# Patient Record
Sex: Male | Born: 1960 | Race: White | Hispanic: No | Marital: Married | Smoking: Former smoker
Health system: Southern US, Community
[De-identification: ages and names within clinical notes are randomized; demographics above are authoritative.]

## PROBLEM LIST (undated history)

## (undated) DIAGNOSIS — M25569 Pain in unspecified knee: Secondary | ICD-10-CM

## (undated) DIAGNOSIS — K219 Gastro-esophageal reflux disease without esophagitis: Secondary | ICD-10-CM

## (undated) DIAGNOSIS — A4902 Methicillin resistant Staphylococcus aureus infection, unspecified site: Secondary | ICD-10-CM

## (undated) DIAGNOSIS — Z9889 Other specified postprocedural states: Secondary | ICD-10-CM

## (undated) DIAGNOSIS — M549 Dorsalgia, unspecified: Secondary | ICD-10-CM

## (undated) HISTORY — PX: KNEE SURGERY: SHX244

## (undated) HISTORY — PX: SHOULDER SURGERY: SHX246

## (undated) HISTORY — PX: GASTRIC BYPASS: SHX52

## (undated) HISTORY — PX: BACK SURGERY: SHX140

---

## 1999-03-26 ENCOUNTER — Emergency Department (HOSPITAL_COMMUNITY): Admission: EM | Admit: 1999-03-26 | Discharge: 1999-03-26 | Payer: Self-pay | Admitting: Emergency Medicine

## 2020-03-17 ENCOUNTER — Other Ambulatory Visit: Payer: Self-pay

## 2020-03-17 ENCOUNTER — Emergency Department (INDEPENDENT_AMBULATORY_CARE_PROVIDER_SITE_OTHER): Payer: Medicare Other

## 2020-03-17 ENCOUNTER — Encounter: Payer: Self-pay | Admitting: Emergency Medicine

## 2020-03-17 ENCOUNTER — Emergency Department (INDEPENDENT_AMBULATORY_CARE_PROVIDER_SITE_OTHER)
Admission: EM | Admit: 2020-03-17 | Discharge: 2020-03-17 | Disposition: A | Discharge status: Home / Self Care | Payer: Medicare Other | Source: Home / Self Care

## 2020-03-17 DIAGNOSIS — M1990 Unspecified osteoarthritis, unspecified site: Secondary | ICD-10-CM

## 2020-03-17 DIAGNOSIS — M25532 Pain in left wrist: Secondary | ICD-10-CM

## 2020-03-17 MED ORDER — PREDNISONE 50 MG PO TABS: 50.0000 mg | ORAL_TABLET | Freq: Every day | ORAL | 0 refills | Status: AC

## 2020-03-17 MED ORDER — METHYLPREDNISOLONE ACETATE 80 MG/ML IJ SUSP
80.0000 mg | Freq: Once | INTRAMUSCULAR | Status: AC
  Administered 2020-03-17: 80 mg via INTRAMUSCULAR

## 2020-03-17 NOTE — Discharge Instructions (Signed)
  You were given your first dose of depo-medrol, a steroid to help with inflammation and pain. You may start your prednisone pills tomorrow with breakfast. Be sure to take with food to help prevent stomach upset, ulcers and stomach bleeds. Call to schedule a follow up appointment with family medicine later this week for further evaluation and treatment of your symptoms.

## 2020-03-17 NOTE — ED Triage Notes (Signed)
Patient c/o left wrist pain x 3-4 days, no known injury, can't remember if he had fallen or not.  Patient has taken Morphine for pain.

## 2020-03-17 NOTE — ED Provider Notes (Signed)
[name]    CSN: [id number] Arrival date & time: 03/17/20  1025      History   Chief Complaint Chief Complaint  Patient presents with  . Wrist Pain    HPI Brady Ochoa is a 59 y.o. male.   HPI  Brady Ochoa is a 59 y.o. male presenting to UC with c/o Left wrist pain that started 3-4 days ago. No known injury.  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 No hx of gout. Denies redness or swelling of wrist.    History reviewed. No pertinent past medical history.  There are no problems to display for this patient.   History reviewed. No pertinent surgical history.     Home Medications    Prior to Admission medications   Medication Sig Start Date End Date Taking? Authorizing Provider  Cholecalciferol 25 MCG (1000 UT) capsule Take by mouth.   Yes [provider]  furosemide (LASIX) 20 MG tablet TAKE 1 TABLET(20 MG) BY MOUTH DAILY 07/24/19  Yes [provider]  gabapentin (NEURONTIN) 600 MG tablet Take by mouth. 02/26/20  Yes [provider]  meclizine (ANTIVERT) 25 MG tablet Take 25 mg by mouth 3 (three) times daily as needed. 02/21/20  Yes [provider]  morphine (MSIR) 15 MG tablet Take by mouth. 07/13/18  Yes [provider]  naloxone (NARCAN) 4 MG/0.1ML LIQD nasal spray kit U 1 SPR IEN 1 TIME 07/13/18  Yes [provider]  omeprazole (PRILOSEC) 20 MG capsule Take by mouth. 09/04/19  Yes [provider]  potassium chloride (MICRO-K) 10 MEQ CR capsule TAKE 1 CAPSULE BY MOUTH WITH EACH LASIX(FUROSEMIDE) DOSE 10/21/14  Yes [provider]  predniSONE (DELTASONE) 50 MG tablet Take 1 tablet (50 mg total) by mouth daily with breakfast for 5 days. 03/17/20 03/22/20  Noe Gens, PA-C    Family History No family  history on file.  Social History Social History   Tobacco Use  . Smoking status: Never Smoker  . Smokeless tobacco: Never Used  Substance Use Topics  . Alcohol use: Not on file  . Drug use: Not on file     Allergies   Patient has no known allergies.   Review of Systems Review of Systems  Musculoskeletal: Positive for arthralgias. Negative for gait problem and joint swelling.  Skin: Negative for color change and wound.     Physical Exam Triage Vital Signs ED Triage Vitals [03/17/20 1113]  Enc Vitals Group     BP 124/79     Pulse Rate 61     Resp      Temp      Temp src      SpO2 93 %     Weight      Height      Head Circumference      Peak Flow      Pain Score 8     Pain Loc      Pain Edu?      Excl. in [place]?    No data found.  Updated Vital Signs BP 124/79 (BP Location: Right Arm)   Pulse 61   SpO2 93%   Visual Acuity Right Eye Distance:   Left Eye Distance:   Bilateral Distance:    Right Eye  Near:   Left Eye Near:    Bilateral Near:     Physical Exam Vitals and nursing note reviewed.  Constitutional:      General: He is not in acute distress.    Appearance: Normal appearance. He is well-developed. He is not ill-appearing, toxic-appearing or diaphoretic.  HENT:     Head: Normocephalic and atraumatic.  Cardiovascular:     Rate and Rhythm: Normal rate and regular rhythm.     Pulses:          Radial pulses are 2+ on the left side.  Pulmonary:     Effort: Pulmonary effort is normal.  Musculoskeletal:        General: Tenderness present. No swelling. Normal range of motion.     Cervical back: Normal range of motion.     Comments: Left wrist: no obvious edema or deformity. Full ROM, diffuse tenderness.  Full ROM Left shoulder, elbow and fingers: no tenderness to shoulder, elbow or fingers.  Skin:    General: Skin is warm and dry.     Capillary Refill: Capillary refill takes less than 2 seconds.     Findings: No bruising or erythema.    Neurological:     Mental Status: He is alert and oriented to person, place, and time.     Sensory: No sensory deficit.  Psychiatric:        Behavior: Behavior normal.      UC Treatments / Results  Labs (all labs ordered are listed, but only abnormal results are displayed) Labs Reviewed - No data to display  EKG   Radiology No results found.  Procedures Procedures (including critical care time)  Medications Ordered in UC Medications  methylPREDNISolone acetate (DEPO-MEDROL) injection 80 mg (80 mg Intramuscular Given 03/17/20 1214)    Initial Impression / Assessment and Plan / UC Course  I have reviewed the triage vital signs and the nursing notes.  Pertinent labs & imaging results that were available during my care of the patient were reviewed by me and considered in my medical decision making (see chart for details).     Discussed imaging with pt Encouraged f/u with PCP, may need referral to specialist for further evaluation of inflammatory arthritis AVS given  Final Clinical Impressions(s) / UC Diagnoses   Final diagnoses:  Left wrist pain  Inflammatory arthritis     Discharge Instructions      You were given your first dose of depo-medrol, a steroid to help with inflammation and pain. You may start your prednisone pills tomorrow with breakfast. Be sure to take with food to help prevent stomach upset, ulcers and stomach bleeds. Call to schedule a follow up appointment with family medicine later this week for further evaluation and treatment of your symptoms.     ED Prescriptions    Medication Sig Dispense Auth. Provider   predniSONE (DELTASONE) 50 MG tablet Take 1 tablet (50 mg total) by mouth daily with breakfast for 5 days. 5 tablet Noe Gens, PA-C     PDMP not reviewed this encounter.   Noe Gens, [place] 03/20/20 [id number]

## 2020-10-05 ENCOUNTER — Other Ambulatory Visit: Payer: Self-pay

## 2020-10-05 ENCOUNTER — Emergency Department (INDEPENDENT_AMBULATORY_CARE_PROVIDER_SITE_OTHER): Payer: Medicare Other

## 2020-10-05 ENCOUNTER — Emergency Department
Admission: EM | Admit: 2020-10-05 | Discharge: 2020-10-05 | Disposition: A | Discharge status: Home / Self Care | Payer: Medicare Other | Source: Home / Self Care | Attending: Family Medicine | Admitting: Family Medicine

## 2020-10-05 ENCOUNTER — Encounter: Payer: Self-pay | Admitting: Emergency Medicine

## 2020-10-05 DIAGNOSIS — S61220A Laceration with foreign body of right index finger without damage to nail, initial encounter: Secondary | ICD-10-CM

## 2020-10-05 DIAGNOSIS — Z23 Encounter for immunization: Secondary | ICD-10-CM | POA: Diagnosis not present

## 2020-10-05 DIAGNOSIS — S61330A Puncture wound without foreign body of right index finger with damage to nail, initial encounter: Secondary | ICD-10-CM | POA: Diagnosis not present

## 2020-10-05 HISTORY — DX: Methicillin resistant Staphylococcus aureus infection, unspecified site: A49.02

## 2020-10-05 HISTORY — DX: Pain in unspecified knee: M25.569

## 2020-10-05 HISTORY — DX: Dorsalgia, unspecified: M54.9

## 2020-10-05 MED ORDER — SULFAMETHOXAZOLE-TRIMETHOPRIM 800-160 MG PO TABS: 1.0000 | ORAL_TABLET | Freq: Two times a day (BID) | ORAL | 0 refills | Status: AC

## 2020-10-05 MED ORDER — ACETAMINOPHEN 500 MG PO TABS
1000.0000 mg | ORAL_TABLET | Freq: Once | ORAL | Status: AC
  Administered 2020-10-05: 1000 mg via ORAL

## 2020-10-05 MED ORDER — TETANUS-DIPHTH-ACELL PERTUSSIS 5-2.5-18.5 LF-MCG/0.5 IM SUSY
0.5000 mL | PREFILLED_SYRINGE | Freq: Once | INTRAMUSCULAR | Status: AC
  Administered 2020-10-05: 0.5 mL via INTRAMUSCULAR

## 2020-10-05 MED ORDER — LIDOCAINE-EPINEPHRINE-TETRACAINE (LET) TOPICAL GEL
3.0000 mL | Freq: Once | TOPICAL | Status: AC
  Administered 2020-10-05: 3 mL via TOPICAL

## 2020-10-05 NOTE — ED Provider Notes (Signed)
[name]    CSN: [id number] Arrival date & time: 10/05/20  1145      History   Chief Complaint Chief Complaint  Patient presents with  . Finger Injury    HPI Brady Ochoa is a 60 y.o. male.   HPI  Patient is here for finger injury.  He cut his Brady Ochoa right index fingertip while putting down ceramic tile.  There is a flap present.  No bleeding.  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  He is not worried about infection at this time, however, he states that he thinks there is a piece of tile inside his finger.  Not easily visible.  Past Medical History:  Diagnosis Date  . Back pain   . Knee pain   . MRSA infection     There are no problems to display for this patient.   Past Surgical History:  Procedure Laterality Date  . BACK SURGERY    . GASTRIC BYPASS    . KNEE SURGERY    . SHOULDER SURGERY         Home Medications    Prior to Admission medications   Medication Sig Start Date End Date Taking? Authorizing Provider  sulfamethoxazole-trimethoprim (BACTRIM DS) 800-160 MG tablet Take 1 tablet by mouth 2 (two) times daily for 7 days. 10/05/20 10/12/20 Yes Raylene Everts, MD  Cholecalciferol 25 MCG (1000 UT) capsule Take by mouth.    [provider]  furosemide (LASIX) 20 MG tablet TAKE 1 TABLET(20 MG) BY MOUTH DAILY 07/24/19   [provider]  gabapentin (NEURONTIN) 600 MG tablet Take by mouth. 02/26/20   [provider]  meclizine (ANTIVERT) 25 MG tablet Take 25 mg by mouth 3 (three) times daily as needed. 02/21/20   [provider]  morphine (MSIR) 15 MG tablet Take by mouth. 07/13/18   [provider]  naloxone Karma Greaser) 4 MG/0.1ML LIQD nasal spray kit U 1 SPR IEN 1 TIME 07/13/18   [provider]  omeprazole (PRILOSEC) 20 MG capsule Take by mouth. 09/04/19   [provider]  potassium chloride (MICRO-K) 10 MEQ CR capsule TAKE 1 CAPSULE BY MOUTH WITH EACH LASIX(FUROSEMIDE) DOSE  10/21/14   [provider]    Family History No family history on file.  Social History Social History   Tobacco Use  . Smoking status: Former Research scientist (life sciences)  . Smokeless tobacco: Never Used     Allergies   Lisinopril   Review of Systems Review of Systems See HPI  Physical Exam Triage Vital Signs ED Triage Vitals  Enc Vitals Group     BP 10/05/20 1216 118/76     Pulse Rate 10/05/20 1216 84     Resp 10/05/20 1216 16     Temp 10/05/20 1215 98.9 F (37.2 C)     Temp Source 10/05/20 1216 Oral     SpO2 10/05/20 1216 95 %     Weight 10/05/20 1217 240 lb (108.9 kg)     Height 10/05/20 1217 _0  (1.702 m)     Head Circumference --      Peak Flow --      Pain Score 10/05/20 1216 7     Pain Loc --      Pain Edu? --      Excl. in [place]? --    No data found.  Updated Vital Signs BP 118/76 (BP Location: Left Arm)   Pulse 84   Temp 98.9 F (37.2 C) (  Oral)   Resp 16   Ht _0  (1.702 m)   Wt 108.9 kg   SpO2 95%   BMI 37.59 kg/m      Physical Exam Constitutional:      General: He is not in acute distress.    Appearance: He is well-developed.  HENT:     Head: Normocephalic and atraumatic.  Eyes:     Conjunctiva/sclera: Conjunctivae normal.     Pupils: Pupils are equal, round, and reactive to light.  Cardiovascular:     Rate and Rhythm: Normal rate.  Pulmonary:     Effort: Pulmonary effort is normal. No respiratory distress.  Abdominal:     General: There is no distension.     Palpations: Abdomen is soft.  Musculoskeletal:        General: Normal range of motion.     Cervical back: Normal range of motion.  Skin:    General: Skin is warm and dry.     Comments: Right index finger has a flap type of laceration.  The flap is dead skin with no vascular supply.  It is removed.  There is no foreign body visible on x-ray.  With minimal exploration it is found and removed.  Areas irrigated.  L ET anesthesia was successful.  Neurological:     Mental Status: He is  alert.  Psychiatric:        Behavior: Behavior normal.      UC Treatments / Results  Labs (all labs ordered are listed, but only abnormal results are displayed) Labs Reviewed - No data to display  EKG   Radiology DG Finger Index Right  Result Date: 10/05/2020 CLINICAL DATA:  Laceration to index finger with possible foreign body. EXAM: RIGHT INDEX FINGER 2+V COMPARISON:  None. FINDINGS: Two linear radiopaque foreign bodies within the superficial soft tissues of the tip of the index finger noted, measuring 0.6 cm and 0.1 cm in length. No fracture or subluxation identified. IMPRESSION: Two linear radiopaque foreign bodies within the superficial soft tissues of the tip of the index finger. Electronically Signed   By: Margarette Canada M.D.   On: 10/05/2020 12:46    Procedures Procedures (including critical care time)  Medications Ordered in UC Medications  Tdap (BOOSTRIX) injection 0.5 mL (0.5 mLs Intramuscular Given 10/05/20 1237)  acetaminophen (TYLENOL) tablet 1,000 mg (1,000 mg Oral Given 10/05/20 1232)  lidocaine-EPINEPHrine-tetracaine (LET) topical gel (3 mLs Topical Given 10/05/20 1325)    Initial Impression / Assessment and Plan / UC Course  I have reviewed the triage vital signs and the nursing notes.  Pertinent labs & imaging results that were available during my care of the patient were reviewed by me and considered in my medical decision making (see chart for details).      Wound is not infection.  It is discussed.  I told him that the first sign of infection he started an antibiotic, but not use it if this heals well without Final Clinical Impressions(s) / UC Diagnoses   Final diagnoses:  Laceration of right index finger with foreign body without damage to nail, initial encounter     Discharge Instructions     Avoid trauma to the finger Watch for infection Return as needed Fill and take antibiotic if you see signs of infection    ED Prescriptions    Medication  Sig Dispense Auth. Provider   sulfamethoxazole-trimethoprim (BACTRIM DS) 800-160 MG tablet Take 1 tablet by mouth 2 (two) times daily for 7 days. 14 tablet  Raylene Everts, MD     PDMP not reviewed this encounter.   Raylene Everts, MD 10/05/20 1520

## 2020-10-05 NOTE — ED Triage Notes (Addendum)
Patient here 2 days after lacerating right index fingertip with ceramic tile; flap is present without bleeding; possible piece of tile seen. History of MRSA infections. Unknown date last tdap. Has had all 3 covid vaccinations.

## 2020-10-05 NOTE — Discharge Instructions (Addendum)
Avoid trauma to the finger Watch for infection Return as needed Fill and take antibiotic if you see signs of infection

## 2020-11-01 ENCOUNTER — Other Ambulatory Visit: Payer: Self-pay

## 2020-11-01 ENCOUNTER — Encounter: Payer: Self-pay | Admitting: Emergency Medicine

## 2020-11-01 ENCOUNTER — Emergency Department
Admission: EM | Admit: 2020-11-01 | Discharge: 2020-11-01 | Disposition: A | Discharge status: Home / Self Care | Payer: Medicare Other | Source: Home / Self Care | Attending: Family Medicine | Admitting: Family Medicine

## 2020-11-01 DIAGNOSIS — G5622 Lesion of ulnar nerve, left upper limb: Secondary | ICD-10-CM | POA: Diagnosis not present

## 2020-11-01 MED ORDER — PREDNISONE 10 MG (21) PO TBPK: ORAL_TABLET | Freq: Every day | ORAL | 0 refills | Status: DC

## 2020-11-01 MED ORDER — KETOROLAC TROMETHAMINE 60 MG/2ML IM SOLN
60.0000 mg | Freq: Once | INTRAMUSCULAR | Status: AC
  Administered 2020-11-01: 60 mg via INTRAMUSCULAR

## 2020-11-01 NOTE — ED Provider Notes (Signed)
[name]    CSN: [id number] Arrival date & time: 11/01/20  0900      History   Chief Complaint Chief Complaint  Patient presents with  . Arm Pain    HPI Brady Ochoa is a 60 y.o. male.   HPI   Patient states that he rapped his left arm on a hard on the surface, and hit just below his elbow near distal ulna.  He states ever since that has been having severe pain that radiates from his elbow all the way down to his fifth finger.  He states that he finds the pain severe, and constant.  He states it is keeping him awake.  He also is getting tingling in his fifth finger.  He has pain with grip and use of his hand.  Pain with certain movements.  Patient has a narcotic contract through a pain clinic in [place].  He is on morphine ER and IR for pain.  I have told him he needs to call his pain clinic if he needs additional pain medication.  He is also on gabapentin.  I have told him he can increase his gabapentin.  Past Medical History:  Diagnosis Date  . Back pain   . Knee pain   . MRSA infection     There are no problems to display for this patient.   Past Surgical History:  Procedure Laterality Date  . BACK SURGERY    . GASTRIC BYPASS    . KNEE SURGERY    . SHOULDER SURGERY         Home Medications    Prior to Admission medications   Medication Sig Start Date End Date Taking? Authorizing Provider  Cholecalciferol 25 MCG (1000 UT) capsule Take by mouth.   Yes [provider]  furosemide (LASIX) 20 MG tablet TAKE 1 TABLET(20 MG) BY MOUTH DAILY 07/24/19  Yes [provider]  gabapentin (NEURONTIN) 600 MG tablet Take by mouth. 02/26/20  Yes [provider]  meclizine (ANTIVERT) 25 MG tablet Take 25 mg by mouth 3 (three) times daily as needed. 02/21/20  Yes [provider]  morphine (MSIR) 15 MG tablet Take by mouth. 07/13/18  Yes [provider]  naloxone (NARCAN) 4 MG/0.1ML LIQD nasal spray kit U 1 SPR IEN 1  TIME 07/13/18  Yes [provider]  omeprazole (PRILOSEC) 20 MG capsule Take by mouth. 09/04/19  Yes [provider]  potassium chloride (MICRO-K) 10 MEQ CR capsule TAKE 1 CAPSULE BY MOUTH WITH EACH LASIX(FUROSEMIDE) DOSE 10/21/14  Yes [provider]  predniSONE (STERAPRED UNI-PAK 21 TAB) 10 MG (21) TBPK tablet Take by mouth daily. Take 6 tabs by mouth daily  for 2 days, then 5 tabs for 2 days, then 4 tabs for 2 days, then 3 tabs for 2 days, 2 tabs for 2 days, then 1 tab by mouth daily for 2 days 11/01/20  Yes Raylene Everts, MD    Family History No family history on file.  Social History Social History   Tobacco Use  . Smoking status: Former Research scientist (life sciences)  . Smokeless tobacco: Never Used     Allergies   Lisinopril   Review of Systems Review of Systems See HPI  Physical Exam Triage Vital Signs ED Triage Vitals [11/01/20 0916]  Enc Vitals Group     BP 132/85     Pulse Rate 64     Resp 16     Temp      Temp src  SpO2 94 %     Weight      Height      Head Circumference      Peak Flow      Pain Score 8     Pain Loc      Pain Edu?      Excl. in [place]?    No data found.  Updated Vital Signs BP 132/85 (BP Location: Left Arm)   Pulse 64   SpO2 94%      Physical Exam Constitutional:      General: He is not in acute distress.    Appearance: He is well-developed. He is obese.     Comments: Appears uncomfortable  HENT:     Head: Normocephalic and atraumatic.     Mouth/Throat:     Comments: Mask is in place Eyes:     Conjunctiva/sclera: Conjunctivae normal.     Pupils: Pupils are equal, round, and reactive to light.  Cardiovascular:     Rate and Rhythm: Normal rate.  Pulmonary:     Effort: Pulmonary effort is normal. No respiratory distress.  Abdominal:     General: There is no distension.     Palpations: Abdomen is soft.  Musculoskeletal:        General: Normal range of motion.     Cervical back: Normal range of motion.  Skin:     General: Skin is warm and dry.  Neurological:     Mental Status: He is alert.     Sensory: Sensory deficit present.     Motor: Weakness present.     Comments: Tenderness at the cubital tunnel at the elbow.  Pain with stretching of ulnar nerve.  Limited grip strength and hand with subjective diminished sensation in the fifth finger.  Psychiatric:        Behavior: Behavior normal.      UC Treatments / Results  Labs (all labs ordered are listed, but only abnormal results are displayed) Labs Reviewed - No data to display  EKG   Radiology No results found.  Procedures Procedures (including critical care time)  Medications Ordered in UC Medications  ketorolac (TORADOL) injection 60 mg (60 mg Intramuscular Given 11/01/20 0957)    Initial Impression / Assessment and Plan / UC Course  I have reviewed the triage vital signs and the nursing notes.  Pertinent labs & imaging results that were available during my care of the patient were reviewed by me and considered in my medical decision making (see chart for details).     Described patient his nerve inflammation.  Treatment includes ice, immobilization, and anti-inflammatories.  He will need to get additional pain medicine, if needed, from his pain clinic.  He should see his PCP if he fails to improve, physical therapy, nerve conduction studies, specialty referral may be indicated Final Clinical Impressions(s) / UC Diagnoses   Final diagnoses:  Cubital tunnel syndrome on left  Ulnar nerve compression, left     Discharge Instructions     Use the ice for 20 min every couple of hours Take the prednisone as directed Take all of day one today I am unable to give you narcotics because of your pain contract.  The toradol will help within the hour Call your pain clinic if you need more morphine See your primary care doctor if you need more testing or referral    ED Prescriptions    Medication Sig Dispense Auth. Provider    predniSONE (STERAPRED UNI-PAK 21 TAB) 10 MG (21)  TBPK tablet Take by mouth daily. Take 6 tabs by mouth daily  for 2 days, then 5 tabs for 2 days, then 4 tabs for 2 days, then 3 tabs for 2 days, 2 tabs for 2 days, then 1 tab by mouth daily for 2 days 42 tablet Meda Coffee Jennette Banker, MD     PDMP not reviewed this encounter.   Raylene Everts, MD 11/01/20 1000

## 2020-11-01 NOTE — ED Triage Notes (Signed)
Patient c/o left forearm pain for several weeks.  No apparent injury.  Patient states that the pain just throbs.  No OTC pain meds.

## 2020-11-01 NOTE — Discharge Instructions (Addendum)
Use the ice for 20 min every couple of hours Take the prednisone as directed Take all of day one today I am unable to give you narcotics because of your pain contract.  The toradol will help within the hour Call your pain clinic if you need more morphine See your primary care doctor if you need more testing or referral

## 2021-01-04 ENCOUNTER — Emergency Department (INDEPENDENT_AMBULATORY_CARE_PROVIDER_SITE_OTHER)
Admission: EM | Admit: 2021-01-04 | Discharge: 2021-01-04 | Disposition: A | Discharge status: Home / Self Care | Payer: Medicare Other | Source: Home / Self Care

## 2021-01-04 ENCOUNTER — Emergency Department (INDEPENDENT_AMBULATORY_CARE_PROVIDER_SITE_OTHER): Payer: Medicare Other

## 2021-01-04 ENCOUNTER — Encounter: Payer: Self-pay | Admitting: Emergency Medicine

## 2021-01-04 ENCOUNTER — Other Ambulatory Visit: Payer: Self-pay

## 2021-01-04 DIAGNOSIS — S6982XA Other specified injuries of left wrist, hand and finger(s), initial encounter: Secondary | ICD-10-CM | POA: Diagnosis not present

## 2021-01-04 DIAGNOSIS — M25532 Pain in left wrist: Secondary | ICD-10-CM | POA: Diagnosis not present

## 2021-01-04 MED ORDER — PREDNISONE 20 MG PO TABS: ORAL_TABLET | ORAL | 0 refills | Status: DC

## 2021-01-04 NOTE — ED Triage Notes (Signed)
Patient presents with left wrist pain. Has been having pain of the left wrist for about a month now. This morning when opening door to he heard something pop, having severe pain with movement of fingers and wrist.

## 2021-01-04 NOTE — ED Provider Notes (Signed)
[facility]    CSN: [id number] Arrival date & time: 01/04/21  1254      History   Chief Complaint Chief Complaint  Patient presents with   Wrist Pain    HPI Brady Ochoa is a 60 y.o. male.   HPI 60 year old male presents with left wrist pain for 1 month, reports this morning when opening door he heard something pop having severe pain with movement of fingers and wrist.  He was evaluated here on 11/01/2020 for cubital tunnel syndrome of left wrist.  Past Medical History:  Diagnosis Date   Back pain    Knee pain    MRSA infection     There are no problems to display for this patient.   Past Surgical History:  Procedure Laterality Date   BACK SURGERY     GASTRIC BYPASS     KNEE SURGERY     SHOULDER SURGERY         Home Medications    Prior to Admission medications   Medication Sig Start Date End Date Taking? Authorizing Provider  Cholecalciferol 25 MCG (1000 UT) capsule Take by mouth.   Yes [provider]  furosemide (LASIX) 20 MG tablet TAKE 1 TABLET(20 MG) BY MOUTH DAILY 07/24/19  Yes [provider]  gabapentin (NEURONTIN) 600 MG tablet Take by mouth. 02/26/20  Yes [provider]  meclizine (ANTIVERT) 25 MG tablet Take 25 mg by mouth 3 (three) times daily as needed. 02/21/20  Yes [provider]  morphine (MSIR) 15 MG tablet Take by mouth. 07/13/18  Yes [provider]  naloxone (NARCAN) 4 MG/0.1ML LIQD nasal spray kit U 1 SPR IEN 1 TIME 07/13/18  Yes [provider]  omeprazole (PRILOSEC) 20 MG capsule Take by mouth. 09/04/19  Yes [provider]  potassium chloride (MICRO-K) 10 MEQ CR capsule TAKE 1 CAPSULE BY MOUTH WITH EACH LASIX(FUROSEMIDE) DOSE 10/21/14  Yes [provider]  predniSONE (DELTASONE) 20 MG tablet Take 3 tabs PO daily x 3 days, then 2 tabs PO daily x 3 days, then 1 tab PO daily x 3 days 01/04/21  Yes Eliezer Lofts, FNP    Family History History reviewed. No  pertinent family history.  Social History Social History   Tobacco Use   Smoking status: Former    Pack years: 0.00   Smokeless tobacco: Never     Allergies   Lisinopril   Review of Systems Review of Systems  Musculoskeletal:        Left wrist pain x1 month  All other systems reviewed and are negative.   Physical Exam Triage Vital Signs ED Triage Vitals  Enc Vitals Group     BP 01/04/21 1352 118/85     Pulse Rate 01/04/21 1352 66     Resp 01/04/21 1352 18     Temp 01/04/21 1352 99 F (37.2 C)     Temp Source 01/04/21 1352 Oral     SpO2 01/04/21 1352 93 %     Weight --      Height --      Head Circumference --      Peak Flow --      Pain Score 01/04/21 1351 8     Pain Loc --      Pain Edu? --      Excl. in [place]? --    No data found.  Updated Vital Signs BP 118/85 (BP Location: Right Arm)   Pulse 66   Temp 99 F (  37.2 C) (Oral)   Resp 18   SpO2 93%   Physical Exam Vitals and nursing note reviewed.  Constitutional:      General: He is not in acute distress.    Appearance: Normal appearance. He is obese. He is ill-appearing.  HENT:     Head: Normocephalic and atraumatic.     Mouth/Throat:     Mouth: Mucous membranes are moist.     Pharynx: Oropharynx is clear.  Eyes:     Extraocular Movements: Extraocular movements intact.     Conjunctiva/sclera: Conjunctivae normal.     Pupils: Pupils are equal, round, and reactive to light.  Cardiovascular:     Rate and Rhythm: Normal rate and regular rhythm.     Pulses: Normal pulses.     Heart sounds: Normal heart sounds.  Pulmonary:     Effort: Pulmonary effort is normal.     Breath sounds: Normal breath sounds.     Comments: No adventitious breath sounds noted Musculoskeletal:     Cervical back: Normal range of motion and neck supple.     Comments: Left wrist (anterior aspect): Exam limited due to left wrist pain today, TTP over distal ulna/proximal first and second metacarpal, LROM with flexion/extension,  pain elicited with ulnar deviation, negative Tinel's sign, unable to perform Wynn Maudlin test  Skin:    General: Skin is warm and dry.  Neurological:     Mental Status: He is alert.     UC Treatments / Results  Labs (all labs ordered are listed, but only abnormal results are displayed) Labs Reviewed - No data to display  EKG   Radiology DG Wrist Complete Left  Result Date: 01/04/2021 CLINICAL DATA:  Wrist pain in a 60 year old male. EXAM: LEFT WRIST - COMPLETE 3+ VIEW COMPARISON:  March 17, 2010. FINDINGS: Cystic changes in the ulnar styloid as before. Signs of lucency about the margins of carpal bones as before. Radiocarpal degenerative changes. No sign of fracture or dislocation. No significant soft tissue swelling. Mild dorsal tilt of the lunate. IMPRESSION: 1. Degenerative changes about the wrist with similar appearance. 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 2. Mild dorsal tilt of the lunate. Question dorsal intercalated segment instability. 3. No sign of acute fracture. Electronically Signed   By: Zetta Bills M.D.   On: 01/04/2021 14:28    Procedures Procedures (including critical care time)  Medications Ordered in UC Medications - No data to display  Initial Impression / Assessment and Plan / UC Course  I have reviewed the triage vital signs and the nursing notes.  Pertinent labs & imaging results that were available during my care of the patient were reviewed by me and considered in my medical decision making (see chart for details).      Final Clinical Impressions(s) / UC Diagnoses   Final diagnoses:  Acute pain of left wrist  Fibrocartilage, triangular complex, injury, left, initial encounter     Discharge Instructions      Advised patient to take medication as directed with food to completion.  Advised patient to wear left wrist brace 24/7 for the next 2 weeks except  when bathing.  Advised patient if pain of left wrist worsens and/or unresolved please follow-up with PCP for referral to Ortho hand specialist for further evaluation.     ED Prescriptions     Medication Sig Dispense Auth. Provider   predniSONE (DELTASONE) 20 MG tablet Take 3 tabs PO  daily x 3 days, then 2 tabs PO daily x 3 days, then 1 tab PO daily x 3 days 18 tablet Eliezer Lofts, FNP      PDMP not reviewed this encounter.   Eliezer Lofts, [place] 01/04/21 1502

## 2021-01-04 NOTE — Discharge Instructions (Addendum)
Advised patient to take medication as directed with food to completion.  Advised patient to wear left wrist brace 24/7 for the next 2 weeks except when bathing.  Advised patient if pain of left wrist worsens and/or unresolved please follow-up with PCP for referral to Ortho hand specialist for further evaluation.

## 2021-02-23 ENCOUNTER — Other Ambulatory Visit: Payer: Self-pay

## 2021-02-23 ENCOUNTER — Emergency Department
Admission: EM | Admit: 2021-02-23 | Discharge: 2021-02-23 | Disposition: A | Discharge status: Home / Self Care | Payer: Medicare Other | Source: Home / Self Care

## 2021-02-23 DIAGNOSIS — L03116 Cellulitis of left lower limb: Secondary | ICD-10-CM | POA: Diagnosis not present

## 2021-02-23 DIAGNOSIS — S81802A Unspecified open wound, left lower leg, initial encounter: Secondary | ICD-10-CM

## 2021-02-23 HISTORY — DX: Gastro-esophageal reflux disease without esophagitis: K21.9

## 2021-02-23 MED ORDER — SULFAMETHOXAZOLE-TRIMETHOPRIM 800-160 MG PO TABS: 1.0000 | ORAL_TABLET | Freq: Two times a day (BID) | ORAL | 0 refills | Status: AC

## 2021-02-23 NOTE — ED Provider Notes (Signed)
[name]    CSN: [id number] Arrival date & time: 02/23/21  0807      History   Chief Complaint Chief Complaint  Patient presents with   Abrasion    HPI Brady Ochoa is a 60 y.o. male.   HPI  Patient is known to me from prior visit.  He is here with a wound infection.  He scraped his leg a few days ago, and noticed yesterday that it is becoming more red, swollen, and painful.  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  He has not had any fever or chills, malaise, or systemic signs of infection.  Past Medical History:  Diagnosis Date   Acid reflux    Back pain    Knee pain    MRSA infection     There are no problems to display for this patient.   Past Surgical History:  Procedure Laterality Date   BACK SURGERY     GASTRIC BYPASS     KNEE SURGERY     SHOULDER SURGERY         Home Medications    Prior to Admission medications   Medication Sig Start Date End Date Taking? Authorizing Provider  sulfamethoxazole-trimethoprim (BACTRIM DS) 800-160 MG tablet Take 1 tablet by mouth 2 (two) times daily for 7 days. 02/23/21 03/02/21 Yes Raylene Everts, MD  Cholecalciferol 25 MCG (1000 UT) capsule Take by mouth.    [provider]  furosemide (LASIX) 20 MG tablet TAKE 1 TABLET(20 MG) BY MOUTH DAILY 07/24/19   [provider]  gabapentin (NEURONTIN) 600 MG tablet Take by mouth. 02/26/20   [provider]  meclizine (ANTIVERT) 25 MG tablet Take 25 mg by mouth 3 (three) times daily as needed. 02/21/20   [provider]  morphine (MSIR) 15 MG tablet Take by mouth. 07/13/18   [provider]  naloxone [facility] Gre[facility]) 4 MG/0.1ML LIQD nasal spray kit U 1 SPR IEN 1 TIME 07/13/18   [provider]  omeprazole (PRILOSEC) 20 MG capsule Take by mouth. 09/04/19   [provider]  potassium chloride (MICRO-K) 10 MEQ CR capsule TAKE 1 CAPSULE BY MOUTH WITH EACH LASIX(FUROSEMIDE) DOSE 10/21/14   [provider]    Family History Family History  Problem Relation Age of Onset   Heart failure Mother    Diabetes Mother    Cancer Father     Social History Social History   Tobacco Use   Smoking status: Former    Types: Cigarettes   Smokeless tobacco: Never  Vaping Use   Vaping Use: Never used  Substance Use Topics   Alcohol use: Not Currently   Drug use: Not Currently     Allergies   Lisinopril   Review of Systems Review of Systems See HPI  Physical Exam Triage Vital Signs ED Triage Vitals  Enc Vitals Group     BP 02/23/21 0823 115/76     Pulse Rate 02/23/21 0823 82     Resp 02/23/21 0823 20     Temp 02/23/21 0823 98.8 F (37.1 C)     Temp Source 02/23/21 0823 Oral     SpO2 02/23/21 0823 94 %     Weight 02/23/21 0818 240 lb (108.9 kg)     Height 02/23/21 0818 _0  (1.702 m)     Head Circumference --      Peak Flow --      Pain Score 02/23/21 0818 6  Pain Loc --      Pain Edu? --      Excl. in [place]? --    No data found.  Updated Vital Signs BP 115/76   Pulse 82   Temp 98.8 F (37.1 C) (Oral)   Resp 20   Ht _0  (1.702 m)   Wt 108.9 kg   SpO2 94%   BMI 37.59 kg/m      Physical Exam Constitutional:      General: He is not in acute distress.    Appearance: He is well-developed. He is obese.  HENT:     Head: Normocephalic and atraumatic.     Mouth/Throat:     Comments: Mask is in place Eyes:     Conjunctiva/sclera: Conjunctivae normal.     Pupils: Pupils are equal, round, and reactive to light.  Cardiovascular:     Rate and Rhythm: Normal rate.  Pulmonary:     Effort: Pulmonary effort is normal. No respiratory distress.  Abdominal:     General: There is no distension.     Palpations: Abdomen is soft.  Musculoskeletal:        General: Normal range of motion.     Cervical back: Normal range of motion.  Skin:    General: Skin is warm and dry.     Comments: Anterior left shin has a long superficial wound with eschar.  Healing.   The ankle on the left is swollen as compared to the right and there is faint erythema surrounding the wound.  It is tender to touch.  Neurological:     Mental Status: He is alert.  Psychiatric:        Mood and Affect: Mood normal.        Behavior: Behavior normal.     UC Treatments / Results  Labs (all labs ordered are listed, but only abnormal results are displayed) Labs Reviewed - No data to display  EKG   Radiology No results found.  Procedures Procedures (including critical care time)  Medications Ordered in UC Medications - No data to display  Initial Impression / Assessment and Plan / UC Course  I have reviewed the triage vital signs and the nursing notes.  Pertinent labs & imaging results that were available during my care of the patient were reviewed by me and considered in my medical decision making (see chart for details).    Early wound infection.  Discussed antibiotics. Discussed pain management.  Patient is on morphine long-acting and IR, he is advised that he can take Tylenol or ibuprofen in addition. Final Clinical Impressions(s) / UC Diagnoses   Final diagnoses:  Cellulitis of leg, left  Leg wound, left, initial encounter     Discharge Instructions      Elevate leg to reduce swelling Take the antibiotic 2 x a day.  Take 2 doses today Call here or PCP if not improving in 2-3 days   ED Prescriptions     Medication Sig Dispense Auth. Provider   sulfamethoxazole-trimethoprim (BACTRIM DS) 800-160 MG tablet Take 1 tablet by mouth 2 (two) times daily for 7 days. 14 tablet Raylene Everts, MD      I have reviewed the PDMP during this encounter.   Raylene Everts, MD 02/23/21 [phone]

## 2021-02-23 NOTE — ED Triage Notes (Signed)
Pt presents to Urgent Care with skin abrasion to L lower leg which he believes to be infected. Pt states he scraped it on an unknown object 3 days ago (indoors). Scabbing and redness noted to anterior L lower leg.

## 2021-02-23 NOTE — Discharge Instructions (Addendum)
Elevate leg to reduce swelling Take the antibiotic 2 x a day.  Take 2 doses today Call here or PCP if not improving in 2-3 days

## 2021-04-06 ENCOUNTER — Emergency Department (INDEPENDENT_AMBULATORY_CARE_PROVIDER_SITE_OTHER)
Admission: EM | Admit: 2021-04-06 | Discharge: 2021-04-06 | Disposition: A | Discharge status: Home / Self Care | Payer: Medicare Other | Source: Home / Self Care | Attending: Family Medicine | Admitting: Family Medicine

## 2021-04-06 ENCOUNTER — Other Ambulatory Visit: Payer: Self-pay

## 2021-04-06 DIAGNOSIS — H6501 Acute serous otitis media, right ear: Secondary | ICD-10-CM

## 2021-04-06 DIAGNOSIS — H9201 Otalgia, right ear: Secondary | ICD-10-CM

## 2021-04-06 HISTORY — DX: Other specified postprocedural states: Z98.890

## 2021-04-06 MED ORDER — AMOXICILLIN-POT CLAVULANATE 875-125 MG PO TABS: 1.0000 | ORAL_TABLET | Freq: Two times a day (BID) | ORAL | 0 refills | Status: DC

## 2021-04-06 MED ORDER — ACETAMINOPHEN 325 MG PO TABS
650.0000 mg | ORAL_TABLET | Freq: Once | ORAL | Status: AC
  Administered 2021-04-06: 650 mg via ORAL

## 2021-04-06 MED ORDER — FLUTICASONE PROPIONATE 50 MCG/ACT NA SUSP: 2.0000 | Freq: Every day | NASAL | 0 refills | Status: AC

## 2021-04-06 NOTE — Discharge Instructions (Addendum)
Use Flonase twice a day until symptoms improve and then once a day through allergy season  take the Augmentin 2 times a day for a week  You may take morphine for your pain.  I am unable to write anything else since you are on a pain contract Follow-up with your usual primary care doctor

## 2021-04-06 NOTE — ED Provider Notes (Signed)
[facility]    CSN: [id number] Arrival date & time: 04/06/21  1651      History   Chief Complaint Chief Complaint  Patient presents with   Otalgia    Rt ear    HPI Brady Ochoa is a 60 y.o. male.   HPI Patient is here for right ear pain.  He states the pain is "severe".  No change in hearing.  No drainage.  No recent cough or cold symptoms.  Past Medical History:  Diagnosis Date   Acid reflux    Back pain    History of surgery on arm    Knee pain    MRSA infection     There are no problems to display for this patient.   Past Surgical History:  Procedure Laterality Date   BACK SURGERY     GASTRIC BYPASS     KNEE SURGERY     SHOULDER SURGERY         Home Medications    Prior to Admission medications   Medication Sig Start Date End Date Taking? Authorizing Provider  amoxicillin-clavulanate (AUGMENTIN) 875-125 MG tablet Take 1 tablet by mouth every 12 (twelve) hours. 04/06/21  Yes Raylene Everts, MD  fluticasone [facility]) 50 MCG/ACT nasal spray Place 2 sprays into both nostrils daily. 04/06/21  Yes Raylene Everts, MD  Cholecalciferol 25 MCG (1000 UT) capsule Take by mouth.    [provider]  furosemide (LASIX) 20 MG tablet TAKE 1 TABLET(20 MG) BY MOUTH DAILY 07/24/19   [provider]  gabapentin (NEURONTIN) 600 MG tablet Take by mouth. 02/26/20   [provider]  meclizine (ANTIVERT) 25 MG tablet Take 25 mg by mouth 3 (three) times daily as needed. 02/21/20   [provider]  morphine (MSIR) 15 MG tablet Take by mouth. 07/13/18   [provider]  naloxone Karma Greaser) 4 MG/0.1ML LIQD nasal spray kit U 1 SPR IEN 1 TIME 07/13/18   [provider]  omeprazole (PRILOSEC) 20 MG capsule Take by mouth. 09/04/19   [provider]  potassium chloride (MICRO-K) 10 MEQ CR capsule TAKE 1 CAPSULE BY MOUTH WITH EACH LASIX(FUROSEMIDE) DOSE 10/21/14   [provider]    Family History Family  History  Problem Relation Age of Onset   Heart failure Mother    Diabetes Mother    Cancer Father     Social History Social History   Tobacco Use   Smoking status: Former    Types: Cigarettes   Smokeless tobacco: Never  Vaping Use   Vaping Use: Never used  Substance Use Topics   Alcohol use: Not Currently   Drug use: Not Currently     Allergies   Lisinopril   Review of Systems Review of Systems See HPI  Physical Exam Triage Vital Signs ED Triage Vitals  Enc Vitals Group     BP 04/06/21 1715 127/84     Pulse Rate 04/06/21 1715 60     Resp 04/06/21 1715 14     Temp 04/06/21 1715 98.3 F (36.8 C)     Temp Source 04/06/21 1715 Oral     SpO2 04/06/21 1715 95 %     Weight --      Height --      Head Circumference --      Peak Flow --      Pain Score 04/06/21 1718 10     Pain Loc --      Pain Edu? --  Excl. in [place]? --    No data found.  Updated Vital Signs BP 127/84 (BP Location: Right Arm)   Pulse 60   Temp 98.3 F (36.8 C) (Oral)   Resp 14   SpO2 95%      Physical Exam Constitutional:      General: He is not in acute distress.    Appearance: He is well-developed.     Comments: Overweight.  Expresses his distress with facial grimacing and holding ear  HENT:     Head: Normocephalic and atraumatic.     Right Ear: Ear canal and external ear normal.     Left Ear: Tympanic membrane, ear canal and external ear normal.     Ears:     Comments: Right TM is dull.  Mild injection    Nose: No congestion or rhinorrhea.     Mouth/Throat:     Mouth: Mucous membranes are moist.     Pharynx: No posterior oropharyngeal erythema.  Eyes:     Conjunctiva/sclera: Conjunctivae normal.     Pupils: Pupils are equal, round, and reactive to light.  Neck:     Comments: Right cervical lymph node enlarged just below ear Cardiovascular:     Rate and Rhythm: Normal rate.  Pulmonary:     Effort: Pulmonary effort is normal. No respiratory distress.  Abdominal:      General: There is no distension.     Palpations: Abdomen is soft.  Musculoskeletal:        General: Normal range of motion.     Cervical back: Normal range of motion.  Lymphadenopathy:     Cervical: Cervical adenopathy present.  Skin:    General: Skin is warm and dry.  Neurological:     Mental Status: He is alert.  Psychiatric:        Mood and Affect: Mood normal.        Behavior: Behavior normal.     UC Treatments / Results  Labs (all labs ordered are listed, but only abnormal results are displayed) Labs Reviewed - No data to display  EKG   Radiology No results found.  Procedures Procedures (including critical care time)  Medications Ordered in UC Medications  acetaminophen (TYLENOL) tablet 650 mg (650 mg Oral Given 04/06/21 1721)    Initial Impression / Assessment and Plan / UC Course  I have reviewed the triage vital signs and the nursing notes.  Pertinent labs & imaging results that were available during my care of the patient were reviewed by me and considered in my medical decision making (see chart for details).     Final Clinical Impressions(s) / UC Diagnoses   Final diagnoses:  Otalgia of right ear  Non-recurrent acute serous otitis media of right ear     Discharge Instructions      Use Flonase twice a day until symptoms improve and then once a day through allergy season  take the Augmentin 2 times a day for a week  You may take morphine for your pain.  I am unable to write anything else since you are on a pain contract Follow-up with your usual primary care doctor   ED Prescriptions     Medication Sig Dispense Auth. Provider   amoxicillin-clavulanate (AUGMENTIN) 875-125 MG tablet Take 1 tablet by mouth every 12 (twelve) hours. 14 tablet Raylene Everts, MD   fluticasone [facility]) 50 MCG/ACT nasal spray Place 2 sprays into both nostrils daily. 16 g Raylene Everts, MD  PDMP not reviewed this encounter.   Raylene Everts,  MD 04/06/21 [phone]

## 2021-04-06 NOTE — ED Triage Notes (Signed)
Pt presents with rt ear pain that began Friday afternoon

## 2021-05-12 DEATH — deceased

## 2021-08-25 IMAGING — DX DG WRIST COMPLETE 3+V*L*
4 series · 4 of 4 positions shown · non-contrast
Comparison: March 17, 2010.

CLINICAL DATA: Wrist pain in a 59-year-old male.

EXAM:
LEFT WRIST - COMPLETE 3+ VIEW

[wrist pa]
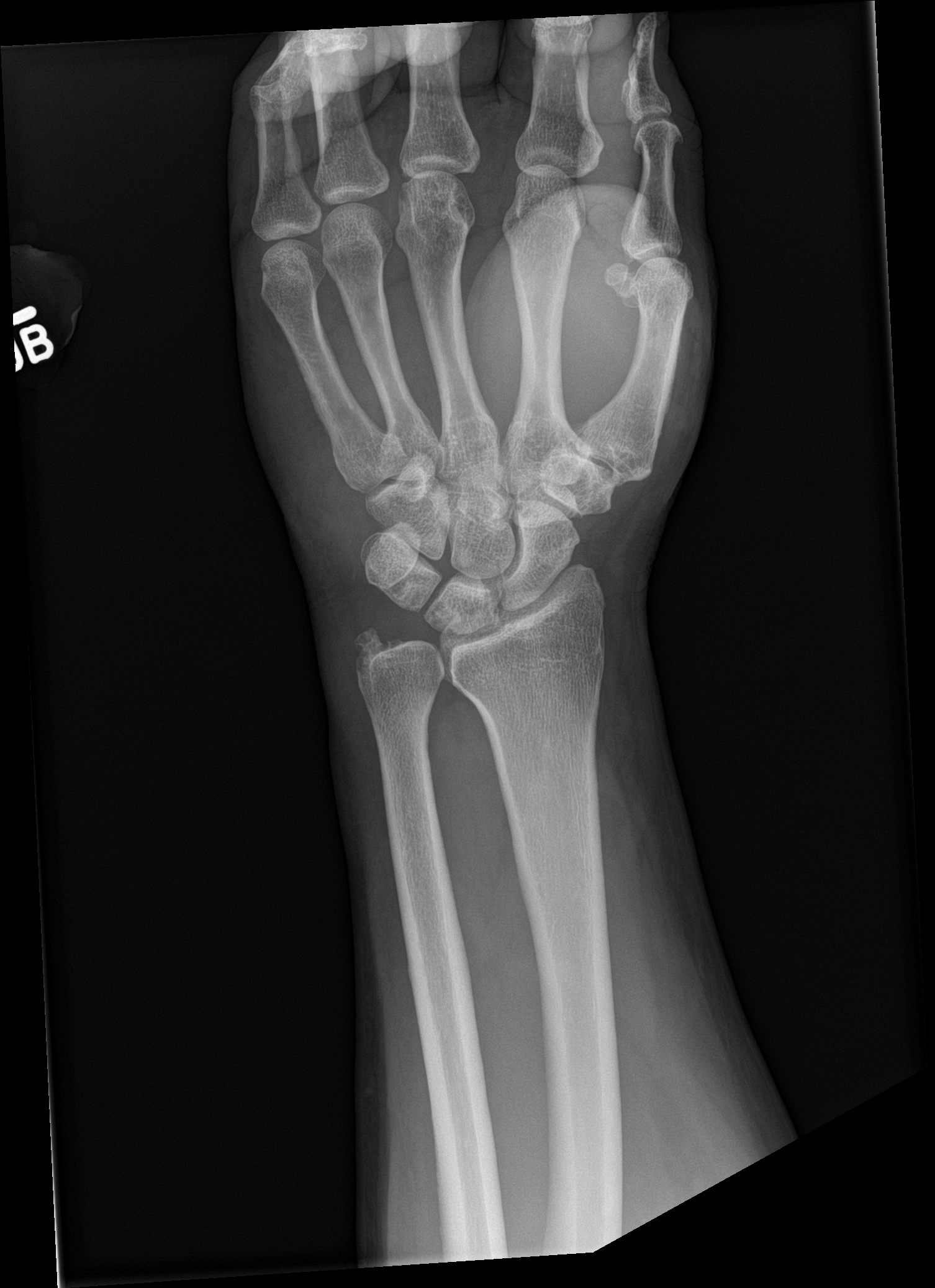

[wrist obl]
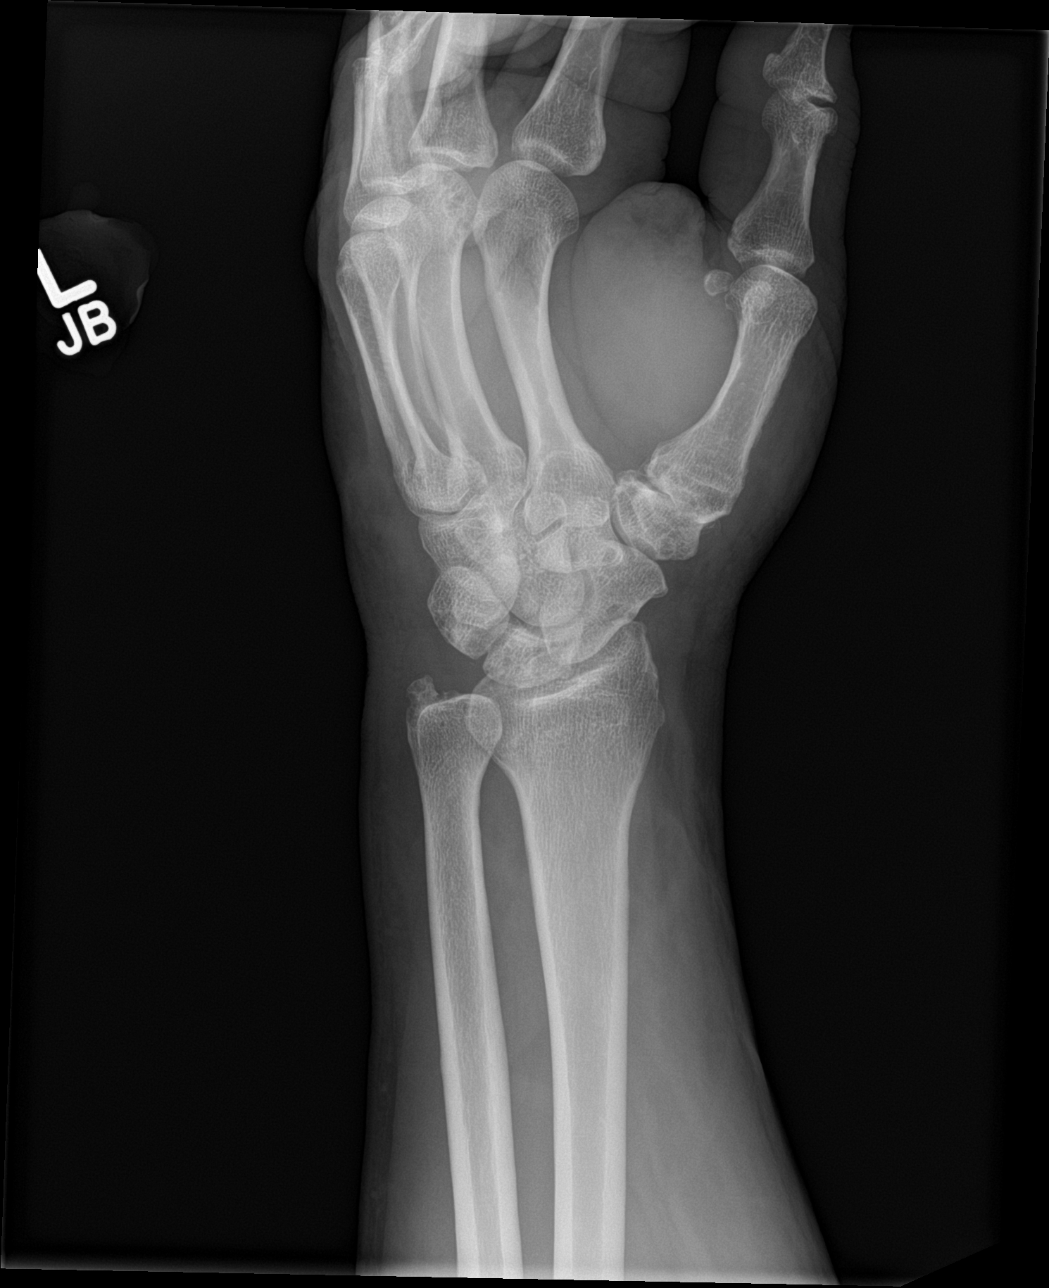

[wrist lat]
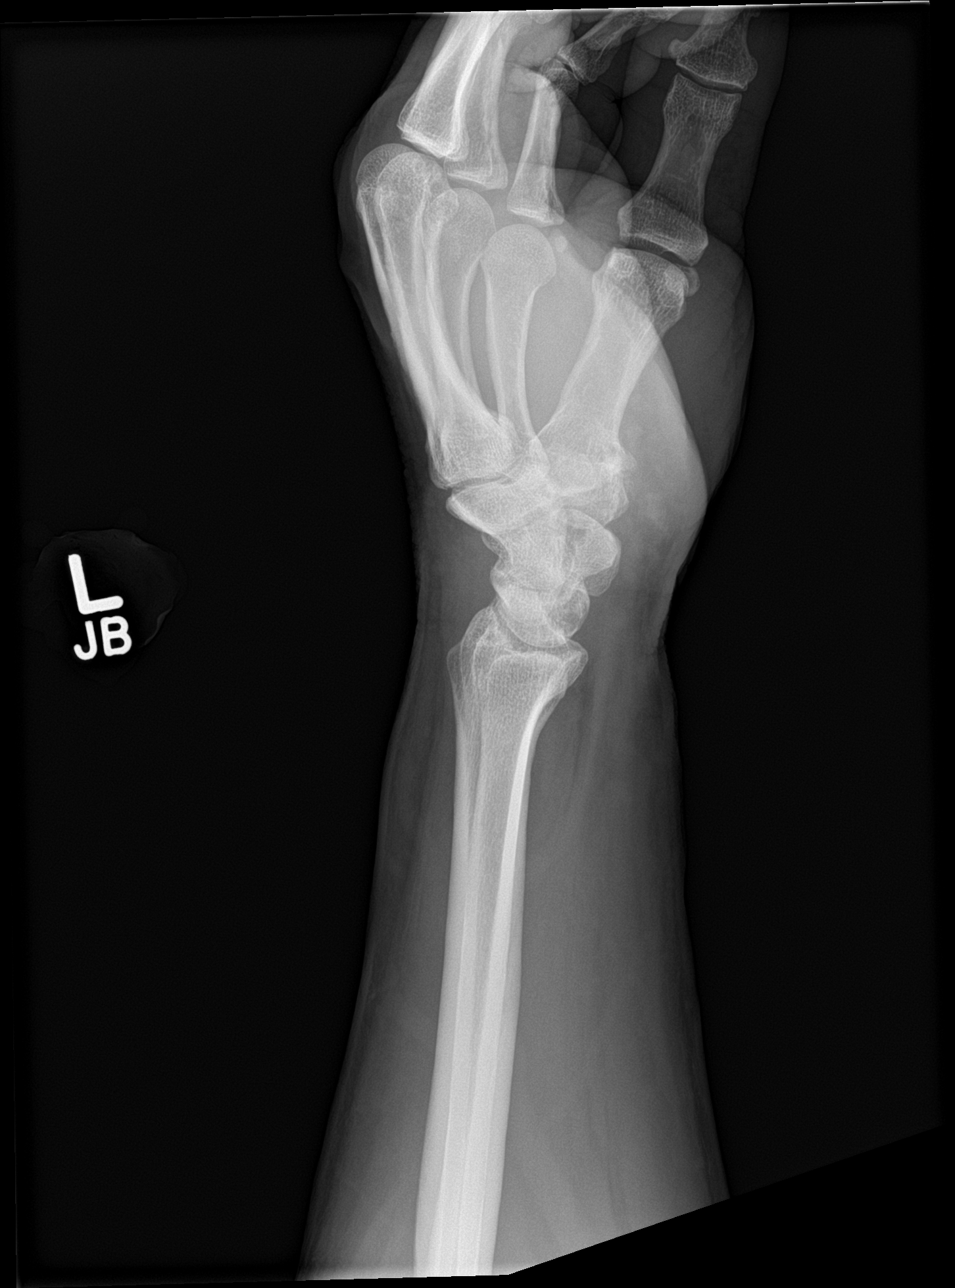

[wrist navicular]
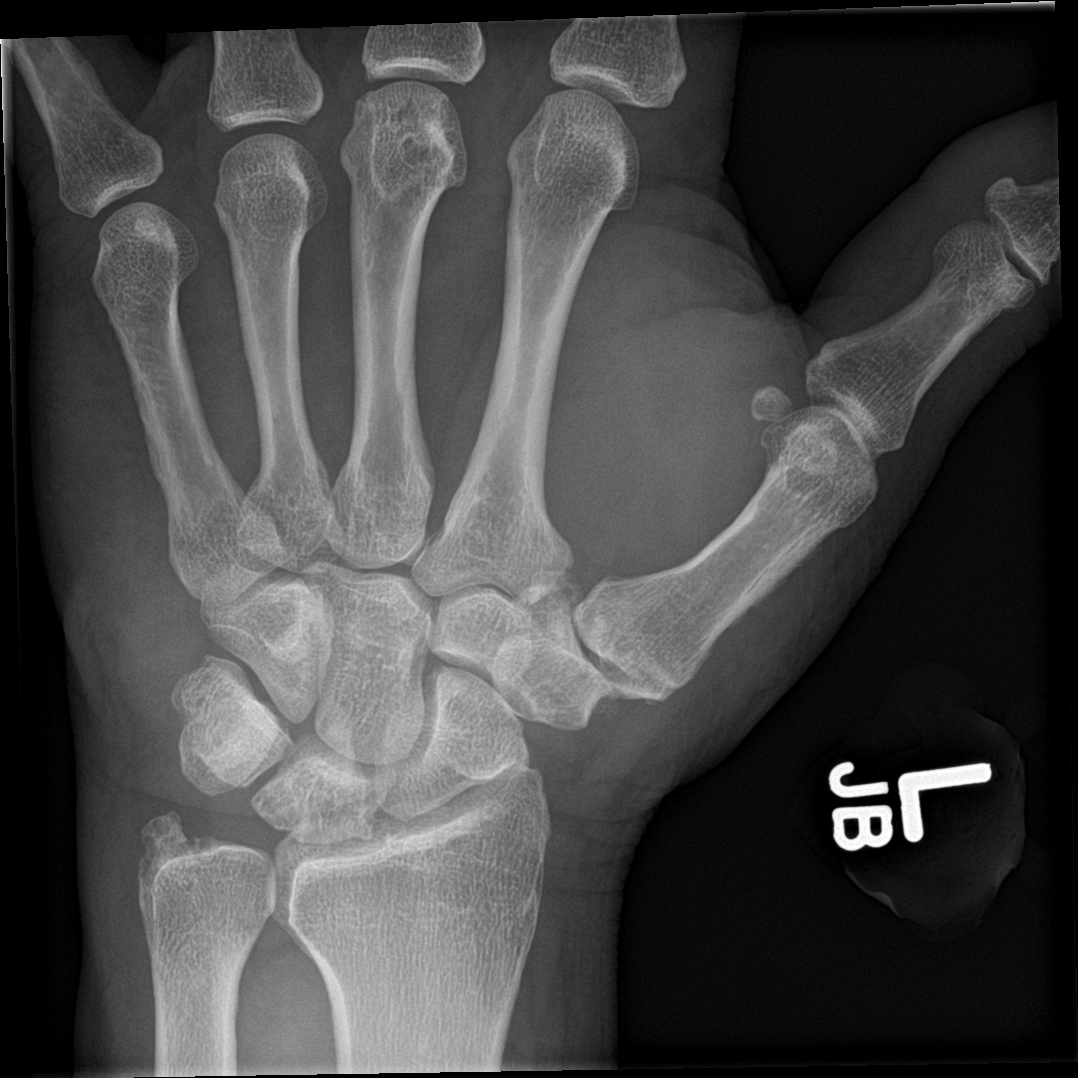

[4 of 4 positions shown; findings below may reference images not displayed]

FINDINGS: Cystic changes in the ulnar styloid as before. Signs of lucency
about the margins of carpal bones as before. Radiocarpal
degenerative changes. No sign of fracture or dislocation.

No significant soft tissue swelling. Mild dorsal tilt of the lunate.
IMPRESSION: 1. Degenerative changes about the wrist with similar appearance.
Pattern of cystic changes in carpal bones and distal ulna raising
the question of chronic triangular fibrocartilage injury.
2. Mild dorsal tilt of the lunate. Question dorsal intercalated
segment instability.
3. No sign of acute fracture.

## 2022-02-19 ENCOUNTER — Encounter: Payer: Self-pay | Admitting: Emergency Medicine

## 2022-02-19 ENCOUNTER — Ambulatory Visit
Admission: EM | Admit: 2022-02-19 | Discharge: 2022-02-19 | Disposition: A | Discharge status: Home / Self Care | Payer: Medicare HMO

## 2022-02-19 ENCOUNTER — Other Ambulatory Visit: Payer: Self-pay

## 2022-02-19 DIAGNOSIS — H6693 Otitis media, unspecified, bilateral: Secondary | ICD-10-CM | POA: Diagnosis not present

## 2022-02-19 MED ORDER — AMOXICILLIN-POT CLAVULANATE 875-125 MG PO TABS: 1.0000 | ORAL_TABLET | Freq: Two times a day (BID) | ORAL | 0 refills | Status: AC

## 2022-02-19 NOTE — Discharge Instructions (Addendum)
Instructed patient to take medication as directed with food to completion.  Encouraged patient increase daily water intake while taking this medication.  Advised patient if symptoms worsen and/or unresolved please follow-up with PCP or here for further evaluation.

## 2022-02-19 NOTE — ED Provider Notes (Signed)
[facility]    CSN: [id number] Arrival date & time: 02/19/22  1647      History   Chief Complaint Chief Complaint  Patient presents with   Dizziness    HPI Brady Ochoa is a 61 y.o. male.   HPI Pleasant 61 year old male presents with dizziness, room spinning, fatigue, ears itching and pressure in ears for 7 days.  Patient reports history of vertigo.  PMH significant for obesity, GERD, and back pain.  Patient is accompanied by his wife who reports patient will have follow-up with cardiologist early next week due to recent hospital admission for leg swelling on 01/30/2022.  Past Medical History:  Diagnosis Date   Acid reflux    Back pain    History of surgery on arm    Knee pain    MRSA infection     There are no problems to display for this patient.   Past Surgical History:  Procedure Laterality Date   BACK SURGERY     GASTRIC BYPASS     KNEE SURGERY     SHOULDER SURGERY         Home Medications    Prior to Admission medications   Medication Sig Start Date End Date Taking? Authorizing Provider  amoxicillin-clavulanate (AUGMENTIN) 875-125 MG tablet Take 1 tablet by mouth 2 (two) times daily for 10 days. 02/19/22 03/01/22 Yes Eliezer Lofts, FNP  oxycodone-acetaminophen (LYNOX) 10-300 MG tablet Take 1 tablet by mouth every 4 (four) hours as needed for pain.   Yes [provider]  Cholecalciferol 25 MCG (1000 UT) capsule Take by mouth.    [provider]  fluticasone (FLONASE) 50 MCG/ACT nasal spray Place 2 sprays into both nostrils daily. 04/06/21   Raylene Everts, MD  furosemide (LASIX) 20 MG tablet TAKE 1 TABLET(20 MG) BY MOUTH DAILY 07/24/19   [provider]  gabapentin (NEURONTIN) 600 MG tablet Take by mouth. 02/26/20   [provider]  meclizine (ANTIVERT) 25 MG tablet Take 25 mg by mouth 3 (three) times daily as needed. 02/21/20   [provider]  morphine (MSIR) 15 MG tablet Take by mouth. 07/13/18    [provider]  naloxone [facility]) 4 MG/0.1ML LIQD nasal spray kit U 1 SPR IEN 1 TIME 07/13/18   [provider]  omeprazole (PRILOSEC) 20 MG capsule Take by mouth. 09/04/19   [provider]  potassium chloride (MICRO-K) 10 MEQ CR capsule TAKE 1 CAPSULE BY MOUTH WITH EACH LASIX(FUROSEMIDE) DOSE 10/21/14   [provider]    Family History Family History  Problem Relation Age of Onset   Heart failure Mother    Diabetes Mother    Cancer Father     Social History Social History   Tobacco Use   Smoking status: Former    Types: Cigarettes   Smokeless tobacco: Never  Vaping Use   Vaping Use: Never used  Substance Use Topics   Alcohol use: Not Currently   Drug use: Not Currently     Allergies   Lisinopril   Review of Systems Review of Systems  HENT:         Bilateral ear itching, fullness x7 days  Neurological:  Positive for dizziness.  All other systems reviewed and are negative.    Physical Exam Triage Vital Signs ED Triage Vitals  Enc Vitals Group     BP 02/19/22 1708 120/84     Pulse Rate 02/19/22 1708 89     Resp 02/19/22 1708 16  Temp 02/19/22 1708 98.9 F (37.2 C)     Temp Source 02/19/22 1708 Oral     SpO2 02/19/22 1708 93 %     Weight 02/19/22 1709 255 lb (115.7 kg)     Height 02/19/22 1709 5' 7"  (1.702 m)     Head Circumference --      Peak Flow --      Pain Score 02/19/22 1709 0     Pain Loc --      Pain Edu? --      Excl. in [place]? --    Orthostatic VS for the past 24 hrs:  BP- Lying Pulse- Lying BP- Sitting Pulse- Sitting BP- Standing at 0 minutes Pulse- Standing at 0 minutes  02/19/22 1701 121/80 91 124/86 88 (!) 144/100 91    Updated Vital Signs BP 120/84 (BP Location: Left Arm)   Pulse 89   Temp 98.9 F (37.2 C) (Oral)   Resp 16   Ht 5' 7"  (1.702 m)   Wt 255 lb (115.7 kg)   SpO2 93%   BMI 39.94 kg/m   Physical Exam Vitals and nursing note reviewed.  Constitutional:      Appearance: Normal  appearance. He is obese.  HENT:     Head: Normocephalic and atraumatic.     Ears:     Comments: Bilateral TM's: Erythematous, bulging    Mouth/Throat:     Mouth: Mucous membranes are moist.     Pharynx: Oropharynx is clear.  Eyes:     Extraocular Movements: Extraocular movements intact.     Conjunctiva/sclera: Conjunctivae normal.     Pupils: Pupils are equal, round, and reactive to light.  Cardiovascular:     Rate and Rhythm: Normal rate and regular rhythm.     Pulses: Normal pulses.     Heart sounds: Normal heart sounds.  Pulmonary:     Effort: Pulmonary effort is normal.     Breath sounds: Normal breath sounds. No wheezing, rhonchi or rales.  Musculoskeletal:     Cervical back: Normal range of motion and neck supple.  Skin:    General: Skin is warm and dry.  Neurological:     General: No focal deficit present.     Mental Status: He is alert and oriented to person, place, and time. Mental status is at baseline.      UC Treatments / Results  Labs (all labs ordered are listed, but only abnormal results are displayed) Labs Reviewed - No data to display  EKG   Radiology No results found.  Procedures Procedures (including critical care time)  Medications Ordered in UC Medications - No data to display  Initial Impression / Assessment and Plan / UC Course  I have reviewed the triage vital signs and the nursing notes.  Pertinent labs & imaging results that were available during my care of the patient were reviewed by me and considered in my medical decision making (see chart for details).     MDM: 1.  Bilateral acute otitis media-Rx'd Augmentin. Instructed patient to take medication as directed with food to completion.  Encouraged patient increase daily water intake while taking this medication.  Advised patient if symptoms worsen and/or unresolved please follow-up with PCP or here for further evaluation.  Patient discharged home, hemodynamically stable. Final  Clinical Impressions(s) / UC Diagnoses   Final diagnoses:  Bilateral acute otitis media     Discharge Instructions      Instructed patient to take medication as directed with food to completion.  Encouraged patient increase daily water intake while taking this medication.  Advised patient if symptoms worsen and/or unresolved please follow-up with PCP or here for further evaluation.     ED Prescriptions     Medication Sig Dispense Auth. Provider   amoxicillin-clavulanate (AUGMENTIN) 875-125 MG tablet Take 1 tablet by mouth 2 (two) times daily for 10 days. 20 tablet Eliezer Lofts, FNP      PDMP not reviewed this encounter.   Eliezer Lofts, [place] 02/19/22 1807

## 2022-02-19 NOTE — ED Triage Notes (Signed)
Dizziness, room spinning, fatigue, ears itching, pressure in ears, tinnitus, intermittent nausea x 7 days Vaccinated

## 2022-04-28 ENCOUNTER — Ambulatory Visit
Admission: EM | Admit: 2022-04-28 | Discharge: 2022-04-28 | Disposition: A | Discharge status: Home / Self Care | Payer: Medicare HMO | Attending: Family Medicine | Admitting: Family Medicine

## 2022-04-28 DIAGNOSIS — H66005 Acute suppurative otitis media without spontaneous rupture of ear drum, recurrent, left ear: Secondary | ICD-10-CM

## 2022-04-28 MED ORDER — AMOXICILLIN-POT CLAVULANATE 875-125 MG PO TABS: 1.0000 | ORAL_TABLET | Freq: Two times a day (BID) | ORAL | 0 refills | Status: DC

## 2022-04-28 MED ORDER — PREDNISONE 20 MG PO TABS: 40.0000 mg | ORAL_TABLET | Freq: Every day | ORAL | 0 refills | Status: DC

## 2022-04-28 NOTE — Discharge Instructions (Signed)
Take prednisone once a day for 5 days Make sure that you are drinking plenty of fluids Take the antibiotic 2 times a day until gone Consider following up with an ear nose and throat doctor if this problem recurs

## 2022-04-28 NOTE — ED Triage Notes (Signed)
Pt c/o LT ear pain x 1 week. Also having headaches with it. Hx of ear infections. Tylenol and ibuprofen prn.

## 2022-04-28 NOTE — ED Provider Notes (Signed)
[name]    CSN: [id number] Arrival date & time: 04/28/22  1349      History   Chief Complaint Chief Complaint  Patient presents with   Headache   Otalgia    LT    HPI Brady Ochoa is a 61 y.o. male.   HPI  Patient has progressively worsening left ear pain, decreased hearing, headache on the left side for the last several days.  He states that he has a history of ear infections.  He also has some sinus congestion and runny nose.  Past Medical History:  Diagnosis Date   Acid reflux    Back pain    History of surgery on arm    Knee pain    MRSA infection     There are no problems to display for this patient.   Past Surgical History:  Procedure Laterality Date   BACK SURGERY     GASTRIC BYPASS     KNEE SURGERY     SHOULDER SURGERY         Home Medications    Prior to Admission medications   Medication Sig Start Date End Date Taking? Authorizing Provider  amoxicillin-clavulanate (AUGMENTIN) 875-125 MG tablet Take 1 tablet by mouth every 12 (twelve) hours. 04/28/22  Yes Raylene Everts, MD  predniSONE (DELTASONE) 20 MG tablet Take 2 tablets (40 mg total) by mouth daily with breakfast. 04/28/22  Yes Raylene Everts, MD  Cholecalciferol 25 MCG (1000 UT) capsule Take by mouth.    [provider]  fluticasone (FLONASE) 50 MCG/ACT nasal spray Place 2 sprays into both nostrils daily. 04/06/21   Raylene Everts, MD  furosemide (LASIX) 20 MG tablet TAKE 1 TABLET(20 MG) BY MOUTH DAILY 07/24/19   [provider]  gabapentin (NEURONTIN) 600 MG tablet Take by mouth. 02/26/20   [provider]  meclizine (ANTIVERT) 25 MG tablet Take 25 mg by mouth 3 (three) times daily as needed. 02/21/20   [provider]  morphine (MSIR) 15 MG tablet Take by mouth. 07/13/18   [provider]  naloxone [facility]) 4 MG/0.1ML LIQD nasal spray kit U 1 SPR IEN 1 TIME 07/13/18   [provider]  omeprazole (PRILOSEC) 20 MG  capsule Take by mouth. 09/04/19   [provider]  oxycodone-acetaminophen (LYNOX) 10-300 MG tablet Take 1 tablet by mouth every 4 (four) hours as needed for pain.    [provider]  potassium chloride (MICRO-K) 10 MEQ CR capsule TAKE 1 CAPSULE BY MOUTH WITH EACH LASIX(FUROSEMIDE) DOSE 10/21/14   [provider]    Family History Family History  Problem Relation Age of Onset   Heart failure Mother    Diabetes Mother    Cancer Father     Social History Social History   Tobacco Use   Smoking status: Former    Types: Cigarettes   Smokeless tobacco: Never  Vaping Use   Vaping Use: Never used  Substance Use Topics   Alcohol use: Not Currently   Drug use: Not Currently     Allergies   Lisinopril   Review of Systems Review of Systems See HPI  Physical Exam Triage Vital Signs ED Triage Vitals  Enc Vitals Group     BP 04/28/22 1357 133/84     Pulse Rate 04/28/22 1357 92     Resp 04/28/22 1357 18     Temp 04/28/22 1357 98.7 F (37.1 C)     Temp Source 04/28/22 1357 Oral  SpO2 04/28/22 1357 95 %     Weight --      Height --      Head Circumference --      Peak Flow --      Pain Score 04/28/22 1358 7     Pain Loc --      Pain Edu? --      Excl. in [place]? --    No data found.  Updated Vital Signs BP 133/84 (BP Location: Right Arm)   Pulse 92   Temp 98.7 F (37.1 C) (Oral)   Resp 18   SpO2 95%      Physical Exam Constitutional:      General: He is not in acute distress.    Appearance: He is well-developed. He is obese.  HENT:     Head: Normocephalic and atraumatic.     Right Ear: External ear normal.     Left Ear: External ear normal.     Ears:     Comments: Right TM is dull.  Left TM is dull with deep red erythema and distortion.  No TM rupture    Nose: Nose normal.     Mouth/Throat:     Mouth: Mucous membranes are moist.     Pharynx: No posterior oropharyngeal erythema.  Eyes:     Conjunctiva/sclera: Conjunctivae normal.      Pupils: Pupils are equal, round, and reactive to light.  Cardiovascular:     Rate and Rhythm: Normal rate.  Pulmonary:     Effort: Pulmonary effort is normal. No respiratory distress.  Abdominal:     General: There is no distension.     Palpations: Abdomen is soft.  Musculoskeletal:        General: Normal range of motion.     Cervical back: Normal range of motion.  Skin:    General: Skin is warm and dry.  Neurological:     General: No focal deficit present.     Mental Status: He is alert.  Psychiatric:        Mood and Affect: Mood normal.        Behavior: Behavior normal.      UC Treatments / Results  Labs (all labs ordered are listed, but only abnormal results are displayed) Labs Reviewed - No data to display  EKG   Radiology No results found.  Procedures Procedures (including critical care time)  Medications Ordered in UC Medications - No data to display  Initial Impression / Assessment and Plan / UC Course  I have reviewed the triage vital signs and the nursing notes.  Pertinent labs & imaging results that were available during my care of the patient were reviewed by me and considered in my medical decision making (see chart for details).     Final Clinical Impressions(s) / UC Diagnoses   Final diagnoses:  Recurrent acute suppurative otitis media without spontaneous rupture of left tympanic membrane     Discharge Instructions      Take prednisone once a day for 5 days Make sure that you are drinking plenty of fluids Take the antibiotic 2 times a day until gone Consider following up with an ear nose and throat doctor if this problem recurs   ED Prescriptions     Medication Sig Dispense Auth. Provider   amoxicillin-clavulanate (AUGMENTIN) 875-125 MG tablet Take 1 tablet by mouth every 12 (twelve) hours. 14 tablet Raylene Everts, MD   predniSONE (DELTASONE) 20 MG tablet Take 2 tablets (40 mg total) by  mouth daily with breakfast. 10 tablet  Raylene Everts, MD      PDMP not reviewed this encounter.   Raylene Everts, MD 04/28/22 [phone]

## 2022-06-08 ENCOUNTER — Ambulatory Visit
Admission: EM | Admit: 2022-06-08 | Discharge: 2022-06-08 | Disposition: A | Discharge status: Home / Self Care | Payer: Medicare HMO | Attending: Family Medicine | Admitting: Family Medicine

## 2022-06-08 ENCOUNTER — Encounter: Payer: Self-pay | Admitting: Family Medicine

## 2022-06-08 ENCOUNTER — Ambulatory Visit (INDEPENDENT_AMBULATORY_CARE_PROVIDER_SITE_OTHER): Payer: Medicare HMO

## 2022-06-08 DIAGNOSIS — R059 Cough, unspecified: Secondary | ICD-10-CM | POA: Diagnosis present

## 2022-06-08 DIAGNOSIS — K219 Gastro-esophageal reflux disease without esophagitis: Secondary | ICD-10-CM | POA: Insufficient documentation

## 2022-06-08 DIAGNOSIS — Z6839 Body mass index (BMI) 39.0-39.9, adult: Secondary | ICD-10-CM | POA: Insufficient documentation

## 2022-06-08 DIAGNOSIS — R509 Fever, unspecified: Secondary | ICD-10-CM | POA: Diagnosis not present

## 2022-06-08 DIAGNOSIS — Z1152 Encounter for screening for COVID-19: Secondary | ICD-10-CM | POA: Insufficient documentation

## 2022-06-08 DIAGNOSIS — J111 Influenza due to unidentified influenza virus with other respiratory manifestations: Secondary | ICD-10-CM | POA: Diagnosis not present

## 2022-06-08 DIAGNOSIS — R0602 Shortness of breath: Secondary | ICD-10-CM

## 2022-06-08 LAB — RESP PANEL BY RT-PCR (FLU A&B, COVID) ARPGX2
Influenza A by PCR: NEGATIVE
Influenza B by PCR: NEGATIVE
SARS Coronavirus 2 by RT PCR: NEGATIVE

## 2022-06-08 MED ORDER — PROMETHAZINE-DM 6.25-15 MG/5ML PO SYRP: 5.0000 mL | ORAL_SOLUTION | Freq: Two times a day (BID) | ORAL | 0 refills | Status: DC | PRN

## 2022-06-08 MED ORDER — BENZONATATE 200 MG PO CAPS: 200.0000 mg | ORAL_CAPSULE | Freq: Three times a day (TID) | ORAL | 0 refills | Status: AC | PRN

## 2022-06-08 MED ORDER — DOXYCYCLINE HYCLATE 100 MG PO CAPS: 100.0000 mg | ORAL_CAPSULE | Freq: Two times a day (BID) | ORAL | 0 refills | Status: AC

## 2022-06-08 MED ORDER — PREDNISONE 20 MG PO TABS: ORAL_TABLET | ORAL | 0 refills | Status: DC

## 2022-06-08 NOTE — ED Triage Notes (Signed)
Cough & body aches w/ sore throat since Saturday  Denies fever OTC  meds - Tylenol sinus  & ibuprofen  - no meds today

## 2022-06-08 NOTE — ED Provider Notes (Addendum)
[facility]    CSN: [id number] Arrival date & time: 06/08/22  0809      History   Chief Complaint Chief Complaint  Patient presents with   Cough    HPI Brady Ochoa is a 61 y.o. male.   HPI 61 year old male presents with cough, body aches, sore throat for 3 days.  PMH significant for morbid obesity, back pain, and acid reflux.  Past Medical History:  Diagnosis Date   Acid reflux    Back pain    History of surgery on arm    Knee pain    MRSA infection     There are no problems to display for this patient.   Past Surgical History:  Procedure Laterality Date   BACK SURGERY     GASTRIC BYPASS     KNEE SURGERY     SHOULDER SURGERY         Home Medications    Prior to Admission medications   Medication Sig Start Date End Date Taking? Authorizing Provider  albuterol (VENTOLIN HFA) 108 (90 Base) MCG/ACT inhaler TAKE 2 PUFFS BY MOUTH EVERY 6 HOURS AS NEEDED FOR WHEEZE 12/26/15  Yes [provider]  amitriptyline (ELAVIL) 50 MG tablet 1-2 o qhs prn 01/28/15  Yes [provider]  amLODipine (NORVASC) 2.5 MG tablet  05/27/14  Yes [provider]  atorvastatin (LIPITOR) 40 MG tablet Take 1 tablet (40 mg) by mouth in the morning. 02/24/22  Yes [provider]  benzonatate (TESSALON) 200 MG capsule Take 1 capsule (200 mg total) by mouth 3 (three) times daily as needed for up to 7 days. 06/08/22 06/15/22 Yes Eliezer Lofts, FNP  doxycycline (VIBRAMYCIN) 100 MG capsule Take 1 capsule (100 mg total) by mouth 2 (two) times daily for 10 days. 06/08/22 06/18/22 Yes Eliezer Lofts, FNP  predniSONE (DELTASONE) 20 MG tablet Take 3 tabs PO daily x 5 days. 06/08/22  Yes Eliezer Lofts, FNP  promethazine-dextromethorphan (PROMETHAZINE-DM) 6.25-15 MG/5ML syrup Take 5 mLs by mouth 2 (two) times daily as needed for cough. 06/08/22  Yes Eliezer Lofts, FNP  testosterone cypionate (DEPOTESTOSTERONE CYPIONATE) 200 MG/ML injection INJECT 0.75 ML INTO  THE MUSCLE ONCE EVERY 10 DAYS 12/10/14  Yes [provider]  tiZANidine (ZANAFLEX) 4 MG tablet TAKE 1/2 TO 1 TABLET BY MOUTH TWICE A DAY FOR MUSCLE SPASMS 10/18/20  Yes [provider]  ALPRAZolam (XANAX) 0.5 MG tablet SMARTSIG:0.5 Tablet(s) By Mouth 1-2 Times Daily    [provider]  Cholecalciferol 25 MCG (1000 UT) capsule Take by mouth.    [provider]  DULoxetine (CYMBALTA) 60 MG capsule Take 60 mg by mouth daily.    [provider]  fluticasone (FLONASE) 50 MCG/ACT nasal spray Place 2 sprays into both nostrils daily. 04/06/21   Raylene Everts, MD  furosemide (LASIX) 20 MG tablet TAKE 1 TABLET(20 MG) BY MOUTH DAILY 07/24/19   [provider]  gabapentin (NEURONTIN) 600 MG tablet Take by mouth. 02/26/20   [provider]  meclizine (ANTIVERT) 25 MG tablet Take 25 mg by mouth 3 (three) times daily as needed. 02/21/20   [provider]  morphine (MSIR) 15 MG tablet Take by mouth. 07/13/18   [provider]  Multiple Vitamins-Minerals (THERA-M) TABS Take 1 tablet by mouth every morning.    [provider]  naloxone [facility]) 4 MG/0.1ML LIQD nasal spray kit U 1 SPR IEN 1 TIME 07/13/18   [provider]  omeprazole (PRILOSEC) 20 MG capsule  Take by mouth. 09/04/19   [provider]  Oxycodone HCl 10 MG TABS Take by mouth.    [provider]  oxycodone-acetaminophen (LYNOX) 10-300 MG tablet Take 1 tablet by mouth every 4 (four) hours as needed for pain.    [provider]  potassium chloride (MICRO-K) 10 MEQ CR capsule TAKE 1 CAPSULE BY MOUTH WITH EACH LASIX(FUROSEMIDE) DOSE 10/21/14   [provider]  rosuvastatin (CRESTOR) 20 MG tablet Take 20 mg by mouth at bedtime.    [provider]  traZODone (DESYREL) 150 MG tablet Take by mouth.    [provider]    Family History Family History  Problem Relation Age of Onset   Heart failure Mother    Diabetes  Mother    Cancer Father     Social History Social History   Tobacco Use   Smoking status: Former    Types: Cigarettes   Smokeless tobacco: Never  Vaping Use   Vaping Use: Never used  Substance Use Topics   Alcohol use: Not Currently   Drug use: Not Currently     Allergies   Blue dyes (parenteral), Black walnut pollen allergy skin test, and Lisinopril   Review of Systems Review of Systems  HENT:  Positive for sore throat.   Respiratory:  Positive for cough.   Musculoskeletal:  Positive for myalgias.  All other systems reviewed and are negative.    Physical Exam Triage Vital Signs ED Triage Vitals  Enc Vitals Group     BP 06/08/22 0825 128/86     Pulse Rate 06/08/22 0825 88     Resp 06/08/22 0825 18     Temp 06/08/22 0825 99.2 F (37.3 C)     Temp Source 06/08/22 0825 Oral     SpO2 06/08/22 0825 92 %     Weight 06/08/22 0826 254 lb (115.2 kg)     Height 06/08/22 0826 _0  (1.702 m)     Head Circumference --      Peak Flow --      Pain Score 06/08/22 0826 4     Pain Loc --      Pain Edu? --      Excl. in [place]? --    No data found.  Updated Vital Signs BP 128/86 (BP Location: Right Arm)   Pulse 88   Temp 99.2 F (37.3 C) (Oral)   Resp 18   Ht _1  (1.702 m)   Wt 254 lb (115.2 kg)   SpO2 92%   BMI 39.78 kg/m      Physical Exam Vitals and nursing note reviewed.  Constitutional:      Appearance: Normal appearance. He is obese. He is ill-appearing.  HENT:     Head: Normocephalic and atraumatic.     Right Ear: Tympanic membrane, ear canal and external ear normal.     Left Ear: Tympanic membrane, ear canal and external ear normal.     Mouth/Throat:     Mouth: Mucous membranes are moist.     Pharynx: Oropharynx is clear.  Eyes:     Extraocular Movements: Extraocular movements intact.     Conjunctiva/sclera: Conjunctivae normal.     Pupils: Pupils are equal, round, and reactive to light.  Cardiovascular:     Rate and Rhythm: Normal rate and  regular rhythm.     Pulses: Normal pulses.     Heart sounds: Normal heart sounds. No murmur heard. Pulmonary:     Effort: Pulmonary effort is normal.  Breath sounds: Normal breath sounds.     Comments: Breath sounds diminished throughout particularly bibasilarly, frequent nonproductive cough noted on exam Musculoskeletal:        General: Normal range of motion.     Cervical back: Normal range of motion and neck supple. No tenderness.  Lymphadenopathy:     Cervical: No cervical adenopathy.  Skin:    General: Skin is warm and dry.  Neurological:     General: No focal deficit present.     Mental Status: He is alert and oriented to person, place, and time. Mental status is at baseline.      UC Treatments / Results  Labs (all labs ordered are listed, but only abnormal results are displayed) Labs Reviewed  RESP PANEL BY RT-PCR (FLU A&B, COVID) [id number]    EKG   Radiology DG Chest 2 View  Result Date: 06/08/2022 CLINICAL DATA:  Cough, shortness of breath EXAM: CHEST - 2 VIEW COMPARISON:  01/30/2022 FINDINGS: The heart size and mediastinal contours are within normal limits. Slightly low lung volumes with mild bibasilar atelectasis. No pleural effusion or pneumothorax. The visualized skeletal structures are unremarkable. IMPRESSION: Slightly low lung volumes with mild bibasilar atelectasis. Electronically Signed   By: Davina Poke D.O.   On: 06/08/2022 09:01    Procedures Procedures (including critical care time)  Medications Ordered in UC Medications - No data to display  Initial Impression / Assessment and Plan / UC Course  I have reviewed the triage vital signs and the nursing notes.  Pertinent labs & imaging results that were available during my care of the patient were reviewed by me and considered in my medical decision making (see chart for details).     MDM: 1. Cough-CXR revealed above, Rx'd doxycycline, prednisone, Tessalon, promethazine DM; 2.  Fever-advised  OTC Tylenol, lab 10093 ordered. Advised patient of chest x-ray results with hard copy provided to patient.  Advised patient to take medication as directed with food to completion.  Advised patient to take prednisone and with first dose of doxycycline for the next 5 of 10 days.  Advised may use Tessalon daily or as needed for cough.  Advised may use Promethazine DM for cough at night due to sedative effects.  Advised patient not to take cough medicines together.  Advised patient may take OTC Tylenol 1000 to 4000 mg as needed daily for fever and myalgias.  Advised we will follow-up with COVID-19/influenza results once received.  Advised if symptoms worsen and/or unresolved please follow-up with PCP or here for further evaluation.  Patient discharged home, hemodynamically stable. Final Clinical Impressions(s) / UC Diagnoses   Final diagnoses:  Influenza-like illness  Cough, unspecified type  Fever, unspecified     Discharge Instructions      Advised patient of chest x-ray results with hard copy provided to patient.  Advised patient to take medication as directed with food to completion.  Advised patient to take prednisone and with first dose of doxycycline for the next 5 of 10 days.  Advised may use Tessalon daily or as needed for cough.  Advised may use Promethazine DM for cough at night due to sedative effects.  Advised patient not to take cough medicines together.  Advised patient may take OTC Tylenol 1000 to 4000 mg as needed daily for fever and myalgias.  Advised we will follow-up with COVID-19/influenza results once received.  Advised if symptoms worsen and/or unresolved please follow-up with PCP or here for further evaluation.     ED Prescriptions  Medication Sig Dispense Auth. Provider   predniSONE (DELTASONE) 20 MG tablet Take 3 tabs PO daily x 5 days. 15 tablet Eliezer Lofts, FNP   doxycycline (VIBRAMYCIN) 100 MG capsule Take 1 capsule (100 mg total) by mouth 2 (two) times daily for  10 days. 20 capsule Eliezer Lofts, FNP   benzonatate (TESSALON) 200 MG capsule Take 1 capsule (200 mg total) by mouth 3 (three) times daily as needed for up to 7 days. 40 capsule Eliezer Lofts, FNP   promethazine-dextromethorphan (PROMETHAZINE-DM) 6.25-15 MG/5ML syrup Take 5 mLs by mouth 2 (two) times daily as needed for cough. 118 mL Eliezer Lofts, FNP      PDMP not reviewed this encounter.   Eliezer Lofts, [place] 06/08/22 [place], [place], [place] 06/08/22 [phone]

## 2022-06-08 NOTE — Discharge Instructions (Addendum)
Advised patient of chest x-ray results with hard copy provided to patient.  Advised patient to take medication as directed with food to completion.  Advised patient to take prednisone and with first dose of doxycycline for the next 5 of 10 days.  Advised may use Tessalon daily or as needed for cough.  Advised may use Promethazine DM for cough at night due to sedative effects.  Advised patient not to take cough medicines together.  Advised patient may take OTC Tylenol 1000 to 4000 mg as needed daily for fever and myalgias.  Advised we will follow-up with COVID-19/influenza results once received.  Advised if symptoms worsen and/or unresolved please follow-up with PCP or here for further evaluation.

## 2022-06-22 ENCOUNTER — Ambulatory Visit
Admission: EM | Admit: 2022-06-22 | Discharge: 2022-06-22 | Disposition: A | Discharge status: Home / Self Care | Payer: Medicare HMO | Attending: Family Medicine | Admitting: Family Medicine

## 2022-06-22 DIAGNOSIS — B029 Zoster without complications: Secondary | ICD-10-CM

## 2022-06-22 MED ORDER — VALACYCLOVIR HCL 1 G PO TABS: 1000.0000 mg | ORAL_TABLET | Freq: Three times a day (TID) | ORAL | 0 refills | Status: AC

## 2022-06-22 NOTE — ED Provider Notes (Addendum)
[facility]    CSN: [id number] Arrival date & time: 06/22/22  1430      History   Chief Complaint Chief Complaint  Patient presents with   Back Pain   Rash    HPI Brady Ochoa is a 61 y.o. male.   HPI 61 year old male presents with left-sided low back pain with accompanying rash and reports history of shingles.  PMH significant for obesity, chronic back pain, and acid reflux.  Past Medical History:  Diagnosis Date   Acid reflux    Back pain    History of surgery on arm    Knee pain    MRSA infection     There are no problems to display for this patient.   Past Surgical History:  Procedure Laterality Date   BACK SURGERY     GASTRIC BYPASS     KNEE SURGERY     SHOULDER SURGERY         Home Medications    Prior to Admission medications   Medication Sig Start Date End Date Taking? Authorizing Provider  valACYclovir (VALTREX) 1000 MG tablet Take 1 tablet (1,000 mg total) by mouth 3 (three) times daily. 06/22/22  Yes Eliezer Lofts, FNP  albuterol (VENTOLIN HFA) 108 (90 Base) MCG/ACT inhaler TAKE 2 PUFFS BY MOUTH EVERY 6 HOURS AS NEEDED FOR WHEEZE 12/26/15   [provider]  ALPRAZolam Duanne Moron) 0.5 MG tablet SMARTSIG:0.5 Tablet(s) By Mouth 1-2 Times Daily    [provider]  amitriptyline (ELAVIL) 50 MG tablet 1-2 o qhs prn 01/28/15   [provider]  amLODipine (NORVASC) 2.5 MG tablet  05/27/14   [provider]  atorvastatin (LIPITOR) 40 MG tablet Take 1 tablet (40 mg) by mouth in the morning. 02/24/22   [provider]  Cholecalciferol 25 MCG (1000 UT) capsule Take by mouth.    [provider]  DULoxetine (CYMBALTA) 60 MG capsule Take 60 mg by mouth daily.    [provider]  fluticasone (FLONASE) 50 MCG/ACT nasal spray Place 2 sprays into both nostrils daily. 04/06/21   Raylene Everts, MD  furosemide (LASIX) 20 MG tablet TAKE 1 TABLET(20 MG) BY MOUTH DAILY 07/24/19   [provider]  gabapentin (NEURONTIN) 600 MG tablet Take by mouth. 02/26/20   [provider]  meclizine (ANTIVERT) 25 MG tablet Take 25 mg by mouth 3 (three) times daily as needed. 02/21/20   [provider]  morphine (MSIR) 15 MG tablet Take by mouth. 07/13/18   [provider]  Multiple Vitamins-Minerals (THERA-M) TABS Take 1 tablet by mouth every morning.    [provider]  naloxone [facility]) 4 MG/0.1ML LIQD nasal spray kit U 1 SPR IEN 1 TIME 07/13/18   [provider]  omeprazole (PRILOSEC) 20 MG capsule Take by mouth. 09/04/19   [provider]  Oxycodone HCl 10 MG TABS Take by mouth.    [provider]  oxycodone-acetaminophen (LYNOX) 10-300 MG tablet Take 1 tablet by mouth every 4 (four) hours as needed for pain.    [provider]  potassium chloride (MICRO-K) 10 MEQ CR capsule TAKE 1 CAPSULE BY MOUTH WITH EACH LASIX(FUROSEMIDE) DOSE 10/21/14   [provider]  predniSONE (DELTASONE) 20 MG tablet Take 3 tabs PO daily x 5 days. 06/08/22   Eliezer Lofts, FNP  promethazine-dextromethorphan (PROMETHAZINE-DM) 6.25-15 MG/5ML syrup Take 5 mLs by mouth 2 (two) times daily as needed for cough. 06/08/22   Eliezer Lofts, FNP  rosuvastatin ([place])  20 MG tablet Take 20 mg by mouth at bedtime.    [provider]  testosterone cypionate (DEPOTESTOSTERONE CYPIONATE) 200 MG/ML injection INJECT 0.75 ML INTO THE MUSCLE ONCE EVERY 10 DAYS 12/10/14   [provider]  traZODone (DESYREL) 150 MG tablet Take by mouth.    [provider]    Family History Family History  Problem Relation Age of Onset   Heart failure Mother    Diabetes Mother    Cancer Father     Social History Social History   Tobacco Use   Smoking status: Former    Types: Cigarettes   Smokeless tobacco: Never  Vaping Use   Vaping Use: Never used  Substance Use Topics   Alcohol use: Not Currently   Drug use: Not Currently      Allergies   Blue dyes (parenteral), Black walnut pollen allergy skin test, and Lisinopril   Review of Systems Review of Systems  Musculoskeletal:  Positive for back pain.  Skin:  Positive for rash.  All other systems reviewed and are negative.    Physical Exam Triage Vital Signs ED Triage Vitals  Enc Vitals Group     BP 06/22/22 1445 136/89     Pulse Rate 06/22/22 1445 85     Resp 06/22/22 1445 18     Temp 06/22/22 1445 98.4 F (36.9 C)     Temp Source 06/22/22 1445 Oral     SpO2 06/22/22 1445 95 %     Weight 06/22/22 1446 255 lb (115.7 kg)     Height --      Head Circumference --      Peak Flow --      Pain Score 06/22/22 1445 7     Pain Loc --      Pain Edu? --      Excl. in [place]? --    No data found.  Updated Vital Signs BP 136/89 (BP Location: Right Arm)   Pulse 85   Temp 98.4 F (36.9 C) (Oral)   Resp 18   Wt 255 lb (115.7 kg)   SpO2 95%   BMI 39.94 kg/m       Physical Exam Vitals and nursing note reviewed.  Constitutional:      General: He is not in acute distress.    Appearance: Normal appearance. He is obese. He is ill-appearing.  HENT:     Head: Normocephalic and atraumatic.     Mouth/Throat:     Mouth: Mucous membranes are moist.     Pharynx: Oropharynx is clear.  Eyes:     Extraocular Movements: Extraocular movements intact.     Conjunctiva/sclera: Conjunctivae normal.     Pupils: Pupils are equal, round, and reactive to light.  Cardiovascular:     Rate and Rhythm: Normal rate and regular rhythm.     Pulses: Normal pulses.     Heart sounds: Normal heart sounds. No murmur heard. Pulmonary:     Effort: Pulmonary effort is normal.     Breath sounds: Normal breath sounds. No wheezing, rhonchi or rales.  Musculoskeletal:        General: Normal range of motion.     Cervical back: Normal range of motion and neck supple.  Skin:    General: Skin is warm and dry.     Comments: Left-sided lower back: Scattered erythematous papules  described as pins-and-needles sharp pain per patient-please see image below  Neurological:     General: No focal deficit present.  Mental Status: He is alert and oriented to person, place, and time. Mental status is at baseline.      UC Treatments / Results  Labs (all labs ordered are listed, but only abnormal results are displayed) Labs Reviewed - No data to display  EKG   Radiology No results found.  Procedures Procedures (including critical care time)  Medications Ordered in UC Medications - No data to display  Initial Impression / Assessment and Plan / UC Course  I have reviewed the triage vital signs and the nursing notes.  Pertinent labs & imaging results that were available during my care of the patient were reviewed by me and considered in my medical decision making (see chart for details).     MDM: 1.  Herpes zoster without complication-Rx'd Valtrex. Instructed patient to discontinue tizanidine while taking Valtrex.  Advised patient to take medication as directed with food to completion.  Encouraged patient to increase daily water intake to 64 ounces per day while taking this medication.  Advised if symptoms worsen and/or unresolved please follow-up with PCP or here for further evaluation.  Patient discharged home, hemodynamically stable. Final Clinical Impressions(s) / UC Diagnoses   Final diagnoses:  Herpes zoster without complication     Discharge Instructions      Instructed patient to discontinue tizanidine while taking Valtrex.  Advised patient to take medication as directed with food to completion.  Encouraged patient to increase daily water intake to 64 ounces per day while taking this medication.  Advised if symptoms worsen and/or unresolved please follow-up with PCP or here for further evaluation.     ED Prescriptions     Medication Sig Dispense Auth. Provider   valACYclovir (VALTREX) 1000 MG tablet Take 1 tablet (1,000 mg total) by mouth 3  (three) times daily. 21 tablet Eliezer Lofts, FNP      PDMP not reviewed this encounter.   Eliezer Lofts, [place] 06/22/22 [place], [place], [place] 06/22/22 1645

## 2022-06-22 NOTE — ED Triage Notes (Signed)
Pt c.o back pain x 1 week. Wife also noticed rash this am on LT side of back. Back pain spans across entire lower side. Has had to have back injections in past, last time was 6 mos ago. Also has hx of shingles. Taking ibuprofen and tylenol prn.

## 2022-06-22 NOTE — Discharge Instructions (Addendum)
Instructed patient to discontinue tizanidine while taking Valtrex.  Advised patient to take medication as directed with food to completion.  Encouraged patient to increase daily water intake to 64 ounces per day while taking this medication.  Advised if symptoms worsen and/or unresolved please follow-up with PCP or here for further evaluation.

## 2022-07-11 ENCOUNTER — Other Ambulatory Visit: Payer: Self-pay

## 2022-07-11 ENCOUNTER — Encounter: Payer: Self-pay | Admitting: Emergency Medicine

## 2022-07-11 ENCOUNTER — Ambulatory Visit
Admission: EM | Admit: 2022-07-11 | Discharge: 2022-07-11 | Disposition: A | Discharge status: Home / Self Care | Payer: Medicare HMO | Attending: Physician Assistant | Admitting: Physician Assistant

## 2022-07-11 DIAGNOSIS — B0229 Other postherpetic nervous system involvement: Secondary | ICD-10-CM | POA: Diagnosis not present

## 2022-07-11 MED ORDER — LIDOCAINE 5 % EX PTCH: 1.0000 | MEDICATED_PATCH | CUTANEOUS | 0 refills | Status: AC

## 2022-07-11 MED ORDER — PREDNISONE 10 MG (21) PO TBPK: ORAL_TABLET | ORAL | 0 refills | Status: DC

## 2022-07-11 NOTE — Discharge Instructions (Signed)
Your rash appears to have healed appropriately.  I am concerned that the pain you are experiencing is from nerve issues.  Please start prednisone taper.  Do not take NSAIDs with this medication including aspirin, ibuprofen/Advil, naproxen/Aleve.  You can use acetaminophen/Tylenol.  Use lidocaine patch as needed.  Increase your gabapentin as we discussed.  Follow-up with your primary care.  If anything worsens or changes and you develop fever, recurrent rash, nausea, vomiting you need to be seen immediately.

## 2022-07-11 NOTE — ED Provider Notes (Signed)
[facility]    CSN: [id number] Arrival date & time: 07/11/22  1422      History   Chief Complaint Chief Complaint  Patient presents with   Herpes Zoster   Back Pain    HPI Brady Ochoa is a 61 y.o. male.   Patient presents today with a several week history of pain in his left flank.  He was seen on 06/22/2022 at which point he was diagnosed with herpes zoster.  He was started on Valtrex and completes course of medication without improvement of symptoms.  He has had episodes in the past related to herpes zoster in the same distribution.  He reports that despite medication pain has continued and is currently rated 10 on a 0-10 pain scale, localized to left flank with radiation throughout back, described as shooting/sharp, no aggravating relieving factors identified.  Does report some nausea as result of the pain but denies any fever, vomiting, weakness.  He has been taking Tylenol and ibuprofen without improvement of symptoms.  He does take gabapentin for chronic neuropathy and has been taking this on a scheduled basis without improvement of symptoms.    Past Medical History:  Diagnosis Date   Acid reflux    Back pain    History of surgery on arm    Knee pain    MRSA infection     There are no problems to display for this patient.   Past Surgical History:  Procedure Laterality Date   BACK SURGERY     GASTRIC BYPASS     KNEE SURGERY     SHOULDER SURGERY         Home Medications    Prior to Admission medications   Medication Sig Start Date End Date Taking? Authorizing Provider  albuterol (VENTOLIN HFA) 108 (90 Base) MCG/ACT inhaler TAKE 2 PUFFS BY MOUTH EVERY 6 HOURS AS NEEDED FOR WHEEZE 12/26/15  Yes [provider]  ALPRAZolam (XANAX) 0.5 MG tablet SMARTSIG:0.5 Tablet(s) By Mouth 1-2 Times Daily   Yes [provider]  amitriptyline (ELAVIL) 50 MG tablet 1-2 o qhs prn 01/28/15  Yes [provider]  amLODipine (NORVASC) 2.5 MG  tablet  05/27/14  Yes [provider]  atorvastatin (LIPITOR) 40 MG tablet Take 1 tablet (40 mg) by mouth in the morning. 02/24/22  Yes [provider]  Cholecalciferol 25 MCG (1000 UT) capsule Take by mouth.   Yes [provider]  DULoxetine (CYMBALTA) 60 MG capsule Take 60 mg by mouth daily.   Yes [provider]  fluticasone (FLONASE) 50 MCG/ACT nasal spray Place 2 sprays into both nostrils daily. 04/06/21  Yes Raylene Everts, MD  furosemide (LASIX) 20 MG tablet TAKE 1 TABLET(20 MG) BY MOUTH DAILY 07/24/19  Yes [provider]  gabapentin (NEURONTIN) 600 MG tablet Take by mouth. 02/26/20  Yes [provider]  lidocaine (LIDODERM) 5 % Place 1 patch onto the skin daily. Remove & Discard patch within 12 hours or as directed by MD 07/11/22  Yes Raspet, Derry Skill, PA-C  meclizine (ANTIVERT) 25 MG tablet Take 25 mg by mouth 3 (three) times daily as needed. 02/21/20  Yes [provider]  morphine (MSIR) 15 MG tablet Take by mouth. 07/13/18  Yes [provider]  Multiple Vitamins-Minerals (THERA-M) TABS Take 1 tablet by mouth every morning.   Yes [provider]  naloxone (NARCAN) 4 MG/0.1ML LIQD nasal spray kit U 1 SPR IEN 1 TIME 07/13/18  Yes [provider]  omeprazole (PRILOSEC) 20 MG capsule Take by mouth. 09/04/19  Yes [provider]  Oxycodone HCl 10 MG TABS Take by mouth.   Yes [provider]  oxycodone-acetaminophen (LYNOX) 10-300 MG tablet Take 1 tablet by mouth every 4 (four) hours as needed for pain.   Yes [provider]  potassium chloride (MICRO-K) 10 MEQ CR capsule TAKE 1 CAPSULE BY MOUTH WITH EACH LASIX(FUROSEMIDE) DOSE 10/21/14  Yes [provider]  predniSONE (STERAPRED UNI-PAK 21 TAB) 10 MG (21) TBPK tablet As directed 07/11/22  Yes Tritia Endo K, PA-C  rosuvastatin (CRESTOR) 20 MG tablet Take 20 mg by mouth at bedtime.   Yes [provider]  testosterone  cypionate (DEPOTESTOSTERONE CYPIONATE) 200 MG/ML injection INJECT 0.75 ML INTO THE MUSCLE ONCE EVERY 10 DAYS 12/10/14  Yes [provider]  traZODone (DESYREL) 150 MG tablet Take by mouth.   Yes [provider]  valACYclovir (VALTREX) 1000 MG tablet Take 1 tablet (1,000 mg total) by mouth 3 (three) times daily. 06/22/22  Yes Eliezer Lofts, FNP    Family History Family History  Problem Relation Age of Onset   Heart failure Mother    Diabetes Mother    Cancer Father     Social History Social History   Tobacco Use   Smoking status: Former    Types: Cigarettes   Smokeless tobacco: Never  Vaping Use   Vaping Use: Never used  Substance Use Topics   Alcohol use: Not Currently   Drug use: Not Currently     Allergies   Blue dyes (parenteral), Black walnut pollen allergy skin test, and Lisinopril   Review of Systems Review of Systems  Constitutional:  Negative for activity change, appetite change, fatigue and fever.  Gastrointestinal:  Positive for nausea. Negative for abdominal pain, diarrhea and vomiting.  Musculoskeletal:  Positive for arthralgias and back pain. Negative for myalgias.  Skin:  Negative for rash (Resolved).  Neurological:  Negative for weakness and numbness.     Physical Exam Triage Vital Signs ED Triage Vitals  Enc Vitals Group     BP 07/11/22 1626 131/87     Pulse Rate 07/11/22 1626 80     Resp 07/11/22 1626 18     Temp 07/11/22 1626 98.3 F (36.8 C)     Temp Source 07/11/22 1626 Oral     SpO2 07/11/22 1626 93 %     Weight --      Height --      Head Circumference --      Peak Flow --      Pain Score 07/11/22 1625 10     Pain Loc --      Pain Edu? --      Excl. in [place]? --    No data found.  Updated Vital Signs BP 131/87 (BP Location: Left Arm)   Pulse 80   Temp 98.3 F (36.8 C) (Oral)   Resp 18   SpO2 93%   Visual Acuity Right Eye Distance:   Left Eye Distance:   Bilateral Distance:    Right Eye Near:   Left Eye  Near:    Bilateral Near:     Physical Exam Vitals reviewed.  Constitutional:      General: He is awake.     Appearance: Normal appearance. He is well-developed. He is not ill-appearing.     Comments: Very pleasant male appears stated age in no acute distress sitting in exam room in obvious discomfort holding his left side  HENT:     Head: Normocephalic and atraumatic.     Mouth/Throat:     Pharynx: No oropharyngeal exudate, posterior oropharyngeal erythema or uvula swelling.  Cardiovascular:     Rate and Rhythm: Normal rate and regular rhythm.     Heart sounds: Normal heart sounds, S1 normal and S2 normal. No murmur heard. Pulmonary:     Effort: Pulmonary effort is normal.     Breath sounds: Normal breath sounds. No stridor. No wheezing, rhonchi or rales.     Comments: Clear to auscultation bilaterally Skin:    Findings: No rash. Rash is not macular or papular.     Comments: No significant rash  Neurological:     Mental Status: He is alert.  Psychiatric:        Behavior: Behavior is cooperative.      UC Treatments / Results  Labs (all labs ordered are listed, but only abnormal results are displayed) Labs Reviewed - No data to display  EKG   Radiology No results found.  Procedures Procedures (including critical care time)  Medications Ordered in UC Medications - No data to display  Initial Impression / Assessment and Plan / UC Course  I have reviewed the triage vital signs and the nursing notes.  Pertinent labs & imaging results that were available during my care of the patient were reviewed by me and considered in my medical decision making (see chart for details).     Rash has resolved.  Given presence of symptoms for several weeks no indication for repeat antiviral therapy.  Concern for postherpetic neuralgia.  Will treat with prednisone taper and he was instructed not to take NSAIDs with this medication due to risk of GI bleeding.  Denies any history of  diabetes.  Can use Tylenol for additional pain relief.  He was prescribed lidocaine patches to help with symptoms.  Discussed that he can increase his gabapentin; currently taking 600 mg at night but can take an additional 300 in the morning.  Discussed that this can make him sleepy so he is not to drive or drink alcohol with this increased dose.  If his symptoms are improving he is to follow-up with his primary care.  Discussed that if he develops any additional symptoms including recurrent rash, fever, nausea, vomiting, weakness he needs to be seen immediately.  Strict return precautions given.  Work excuse note provided.  Final Clinical Impressions(s) / UC Diagnoses   Final diagnoses:  Post herpetic neuralgia     Discharge Instructions      Your rash appears to have healed appropriately.  I am concerned that the pain you are experiencing is from nerve issues.  Please start prednisone taper.  Do not take NSAIDs with this medication including aspirin, ibuprofen/Advil, naproxen/Aleve.  You can use acetaminophen/Tylenol.  Use lidocaine patch as needed.  Increase your gabapentin as we discussed.  Follow-up with your primary care.  If anything worsens or changes and you develop fever, recurrent rash, nausea, vomiting you need to be seen immediately.     ED Prescriptions     Medication Sig Dispense Auth. Provider   predniSONE (STERAPRED UNI-PAK 21 TAB) 10 MG (21) TBPK tablet As directed 21 tablet Raspet, Erin K, PA-C   lidocaine (LIDODERM) 5 % Place 1 patch onto the skin daily. Remove & Discard patch within 12 hours or as directed by MD 7 patch Raspet, Erin K, PA-C      PDMP not reviewed this encounter.   Raspet, Derry Skill, PA-C  07/11/22 1701

## 2022-07-11 NOTE — ED Triage Notes (Signed)
Patient presents to Urgent Care with complaints of shingles /back pain since 06/22/22. Patient reports was seen for shingles and they did not go away after treatment. Still having the rash and severe pain. Has finished Valtrex

## 2022-12-15 ENCOUNTER — Ambulatory Visit
Admission: EM | Admit: 2022-12-15 | Discharge: 2022-12-15 | Disposition: A | Discharge status: Home / Self Care | Payer: Medicare HMO | Attending: Family Medicine | Admitting: Family Medicine

## 2022-12-15 ENCOUNTER — Encounter: Payer: Self-pay | Admitting: Emergency Medicine

## 2022-12-15 DIAGNOSIS — H6693 Otitis media, unspecified, bilateral: Secondary | ICD-10-CM | POA: Diagnosis not present

## 2022-12-15 MED ORDER — AMOXICILLIN-POT CLAVULANATE 875-125 MG PO TABS: 1.0000 | ORAL_TABLET | Freq: Two times a day (BID) | ORAL | 0 refills | Status: AC

## 2022-12-15 NOTE — ED Triage Notes (Signed)
Patient also c/o SOB x 2 days.

## 2022-12-15 NOTE — ED Triage Notes (Signed)
Patient c/o bilateral ear pain, right ear is worse than left x 1 week.  Patient is having severe dizziness.  Patient has taken Dramamine w/o relief.  Patient is having nausea.

## 2022-12-15 NOTE — Discharge Instructions (Addendum)
Instructed patient to take medication as directed with food to completion.  Encouraged increase daily water intake to 64 ounces per day while taking this medication.  Advised if symptoms worsen and/or unresolved please follow-up with PCP, ENT or here for further evaluation.  ENT contact information provided with this AVS today.

## 2022-12-15 NOTE — ED Provider Notes (Signed)
[name]    CSN: [id number] Arrival date & time: 12/15/22  0803      History   Chief Complaint Chief Complaint  Patient presents with   Otalgia    HPI Brady Ochoa is a 62 y.o. male.   HPI VERY PLEASANT 53-YEAR-OLD MALE PRESENTS WITH BILATERAL EAR PAIN AND DIZZINESS FOR 4 DAYS.  PATIENT IS ACCOMPANIED BY HIS DAUGHTER THIS MORNING.  PMH significant for obesity (previous gastric bypass surgery), dizziness, and acid reflux.  Past Medical History:  Diagnosis Date   Acid reflux    Back pain    History of surgery on arm    Knee pain    MRSA infection     There are no problems to display for this patient.   Past Surgical History:  Procedure Laterality Date   BACK SURGERY     GASTRIC BYPASS     KNEE SURGERY     SHOULDER SURGERY         Home Medications    Prior to Admission medications   Medication Sig Start Date End Date Taking? Authorizing Provider  albuterol (VENTOLIN HFA) 108 (90 Base) MCG/ACT inhaler TAKE 2 PUFFS BY MOUTH EVERY 6 HOURS AS NEEDED FOR WHEEZE 12/26/15  Yes [provider]  ALPRAZolam (XANAX) 0.5 MG tablet SMARTSIG:0.5 Tablet(s) By Mouth 1-2 Times Daily   Yes [provider]  amitriptyline (ELAVIL) 50 MG tablet 1-2 o qhs prn 01/28/15  Yes [provider]  amLODipine (NORVASC) 2.5 MG tablet  05/27/14  Yes [provider]  amoxicillin-clavulanate (AUGMENTIN) 875-125 MG tablet Take 1 tablet by mouth 2 (two) times daily for 10 days. 12/15/22 12/25/22 Yes Trevor Iha, FNP  atorvastatin (LIPITOR) 40 MG tablet Take 1 tablet (40 mg) by mouth in the morning. 02/24/22  Yes [provider]  Cholecalciferol 25 MCG (1000 UT) capsule Take by mouth.   Yes [provider]  DULoxetine (CYMBALTA) 60 MG capsule Take 60 mg by mouth daily.   Yes [provider]  fluticasone (FLONASE) 50 MCG/ACT nasal spray Place 2 sprays into both nostrils daily. 04/06/21  Yes Eustace Moore, MD  furosemide  (LASIX) 20 MG tablet TAKE 1 TABLET(20 MG) BY MOUTH DAILY 07/24/19  Yes [provider]  gabapentin (NEURONTIN) 600 MG tablet Take by mouth. 02/26/20  Yes [provider]  lidocaine (LIDODERM) 5 % Place 1 patch onto the skin daily. Remove & Discard patch within 12 hours or as directed by MD 07/11/22  Yes Raspet, Noberto Retort, PA-C  meclizine (ANTIVERT) 25 MG tablet Take 25 mg by mouth 3 (three) times daily as needed. 02/21/20  Yes [provider]  morphine (MSIR) 15 MG tablet Take by mouth. 07/13/18  Yes [provider]  Multiple Vitamins-Minerals (THERA-M) TABS Take 1 tablet by mouth every morning.   Yes [provider]  naloxone (NARCAN) 4 MG/0.1ML LIQD nasal spray kit U 1 SPR IEN 1 TIME 07/13/18  Yes [provider]  omeprazole (PRILOSEC) 20 MG capsule Take by mouth. 09/04/19  Yes [provider]  Oxycodone HCl 10 MG TABS Take by mouth.   Yes [provider]  oxycodone-acetaminophen (LYNOX) 10-300 MG tablet Take 1 tablet by mouth every 4 (four) hours as needed for pain.   Yes [provider]  potassium chloride (MICRO-K) 10 MEQ CR capsule TAKE 1 CAPSULE BY MOUTH WITH EACH LASIX(FUROSEMIDE) DOSE 10/21/14  Yes [provider]  predniSONE (STERAPRED UNI-PAK 21 TAB) 10 MG (21) TBPK tablet As  directed 07/11/22  Yes Raspet, Erin K, PA-C  rosuvastatin (CRESTOR) 20 MG tablet Take 20 mg by mouth at bedtime.   Yes [provider]  testosterone cypionate (DEPOTESTOSTERONE CYPIONATE) 200 MG/ML injection INJECT 0.75 ML INTO THE MUSCLE ONCE EVERY 10 DAYS 12/10/14  Yes [provider]  traZODone (DESYREL) 150 MG tablet Take by mouth.   Yes [provider]  valACYclovir (VALTREX) 1000 MG tablet Take 1 tablet (1,000 mg total) by mouth 3 (three) times daily. 06/22/22  Yes Trevor Iha, FNP    Family History Family History  Problem Relation Age of Onset   Heart failure Mother    Diabetes Mother    Cancer  Father     Social History Social History   Tobacco Use   Smoking status: Former    Types: Cigarettes   Smokeless tobacco: Never  Vaping Use   Vaping Use: Never used  Substance Use Topics   Alcohol use: Not Currently   Drug use: Not Currently     Allergies   Blue dyes (parenteral), Black walnut pollen allergy skin test, and Lisinopril   Review of Systems Review of Systems  HENT:  Positive for ear pain.   Neurological:  Positive for dizziness.  All other systems reviewed and are negative.    Physical Exam Triage Vital Signs ED Triage Vitals  Enc Vitals Group     BP      Pulse      Resp      Temp      Temp src      SpO2      Weight      Height      Head Circumference      Peak Flow      Pain Score      Pain Loc      Pain Edu?      Excl. in GC?    No data found.  Updated Vital Signs BP 120/83 (BP Location: Right Arm)   Pulse 78   Temp 98.5 F (36.9 C) (Oral)   Ht 5\' 7"  (1.702 m)   Wt 260 lb (117.9 kg)   SpO2 91%   BMI 40.72 kg/m      Physical Exam Vitals and nursing note reviewed.  Constitutional:      Appearance: Normal appearance. He is obese. He is ill-appearing.  HENT:     Head: Normocephalic and atraumatic.     Right Ear: External ear normal.     Left Ear: External ear normal.     Ears:     Comments: Bilateral TM's: Erythematous, bulging, red round; mild eustachian tube dysfunction noted bilaterally    Mouth/Throat:     Mouth: Mucous membranes are moist.     Pharynx: Oropharynx is clear.  Eyes:     Extraocular Movements: Extraocular movements intact.     Conjunctiva/sclera: Conjunctivae normal.     Pupils: Pupils are equal, round, and reactive to light.  Cardiovascular:     Rate and Rhythm: Normal rate and regular rhythm.     Pulses: Normal pulses.     Heart sounds: Normal heart sounds.  Pulmonary:     Effort: Pulmonary effort is normal.     Breath sounds: Normal breath sounds. No wheezing, rhonchi or rales.  Musculoskeletal:         General: Normal range of motion.     Cervical back: Normal range of motion and neck supple.  Skin:    General: Skin is warm and dry.  Neurological:     General: No focal deficit present.     Mental Status: He is alert and oriented to person, place, and time. Mental status is at baseline.  Psychiatric:        Mood and Affect: Mood normal.        Behavior: Behavior normal.      UC Treatments / Results  Labs (all labs ordered are listed, but only abnormal results are displayed) Labs Reviewed - No data to display  EKG   Radiology No results found.  Procedures Procedures (including critical care time)  Medications Ordered in UC Medications - No data to display  Initial Impression / Assessment and Plan / UC Course  I have reviewed the triage vital signs and the nursing notes.  Pertinent labs & imaging results that were available during my care of the patient were reviewed by me and considered in my medical decision making (see chart for details).     MDM: 1.  Acute bilateral otitis media-Rx'd Augmentin 875/125 mg tablet twice daily x 10 days. Instructed patient to take medication as directed with food to completion.  Encouraged increase daily water intake to 64 ounces per day while taking this medication.  Advised if symptoms worsen and/or unresolved please follow-up with PCP, ENT or here for further evaluation.  ENT contact information provided with this AVS today.  Patient discharged home, hemodynamically stable.  Final Clinical Impressions(s) / UC Diagnoses   Final diagnoses:  Acute bilateral otitis media     Discharge Instructions      Instructed patient to take medication as directed with food to completion.  Encouraged increase daily water intake to 64 ounces per day while taking this medication.  Advised if symptoms worsen and/or unresolved please follow-up with PCP, ENT or here for further evaluation.  ENT contact information provided with this AVS  today.     ED Prescriptions     Medication Sig Dispense Auth. Provider   amoxicillin-clavulanate (AUGMENTIN) 875-125 MG tablet Take 1 tablet by mouth 2 (two) times daily for 10 days. 20 tablet Trevor Iha, FNP      PDMP not reviewed this encounter.   Trevor Iha, FNP 12/15/22 [phone]

## 2023-08-25 ENCOUNTER — Ambulatory Visit
Admission: EM | Admit: 2023-08-25 | Discharge: 2023-08-25 | Disposition: A | Discharge status: Home / Self Care | Payer: Medicare Other | Attending: Family Medicine | Admitting: Family Medicine

## 2023-08-25 DIAGNOSIS — R42 Dizziness and giddiness: Secondary | ICD-10-CM

## 2023-08-25 DIAGNOSIS — M5442 Lumbago with sciatica, left side: Secondary | ICD-10-CM | POA: Diagnosis not present

## 2023-08-25 DIAGNOSIS — M5441 Lumbago with sciatica, right side: Secondary | ICD-10-CM

## 2023-08-25 MED ORDER — KETOROLAC TROMETHAMINE 30 MG/ML IJ SOLN
30.0000 mg | Freq: Once | INTRAMUSCULAR | Status: AC
  Administered 2023-08-25: 30 mg via INTRAMUSCULAR

## 2023-08-25 MED ORDER — PREDNISONE 20 MG PO TABS: 40.0000 mg | ORAL_TABLET | Freq: Every day | ORAL | 0 refills | Status: AC

## 2023-08-25 NOTE — ED Triage Notes (Signed)
Pt presents to [facility] with co of back pain following Covid 19 1 month ago. Pt reports he has pain bilaterally in lower back radiating down bilateral legs. Pt reports pain is 8/10 constant with worsening with movement.

## 2023-08-25 NOTE — Discharge Instructions (Signed)
Take prednisone 20 mg--2 daily for 5 days  You have been given a shot of Toradol 30 mg today.  Please follow-up with your pain management and your primary care about this issue.  We have drawn blood to check your blood counts and your electrolytes and sugar and kidney function numbers.  Our staff will notify you if anything is significantly abnormal.

## 2023-08-25 NOTE — ED Provider Notes (Signed)
[name]    CSN: [id number] Arrival date & time: 08/25/23  1141      History   Chief Complaint Chief Complaint  Patient presents with   Back Pain    HPI Brady Ochoa is a 63 y.o. male.    Back Pain Here for low back pain.  He states he had COVID about 1 month ago and his back was bothering him around that time.  No trauma or fall.  It hurts him along his lumbosacral area bilaterally and along the midline also.  It radiates into both legs, into the calves.  No loss of bowel or bladder control.  No fever or rash and no hematuria or dysuria.  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  He has had a gastric bypass and so cannot take oral NSAIDs.  His last EGFR was normal in care everywhere.  He is allergic to lisinopril.  Since he has had COVID he has been very dizzy.  Past Medical History:  Diagnosis Date   Acid reflux    Back pain    History of surgery on arm    Knee pain    MRSA infection     There are no active problems to display for this patient.   Past Surgical History:  Procedure Laterality Date   BACK SURGERY     GASTRIC BYPASS     KNEE SURGERY     SHOULDER SURGERY         Home Medications    Prior to Admission medications   Medication Sig Start Date End Date Taking? Authorizing Provider  predniSONE (DELTASONE) 20 MG tablet Take 2 tablets (40 mg total) by mouth daily with breakfast for 5 days. 08/25/23 08/30/23 Yes Zenia Resides, MD  albuterol (VENTOLIN HFA) 108 (90 Base) MCG/ACT inhaler TAKE 2 PUFFS BY MOUTH EVERY 6 HOURS AS NEEDED FOR WHEEZE 12/26/15   [provider]  ALPRAZolam Prudy Feeler) 0.5 MG tablet SMARTSIG:0.5 Tablet(s) By Mouth 1-2 Times Daily    [provider]  amitriptyline (ELAVIL) 50 MG tablet 1-2 o qhs prn 01/28/15   [provider]  amLODipine (NORVASC) 2.5 MG tablet  05/27/14   [provider]  atorvastatin (LIPITOR) 40 MG tablet Take 1 tablet (40 mg)  by mouth in the morning. 02/24/22   [provider]  Cholecalciferol 25 MCG (1000 UT) capsule Take by mouth.    [provider]  DULoxetine (CYMBALTA) 60 MG capsule Take 60 mg by mouth daily.    [provider]  fluticasone (FLONASE) 50 MCG/ACT nasal spray Place 2 sprays into both nostrils daily. 04/06/21   Eustace Moore, MD  furosemide (LASIX) 20 MG tablet TAKE 1 TABLET(20 MG) BY MOUTH DAILY 07/24/19   [provider]  gabapentin (NEURONTIN) 600 MG tablet Take by mouth. 02/26/20   [provider]  lidocaine (LIDODERM) 5 % Place 1 patch onto the skin daily. Remove & Discard patch within 12 hours or as directed by MD 07/11/22   Raspet, Noberto Retort, PA-C  meclizine (ANTIVERT) 25 MG tablet Take 25 mg by mouth 3 (three) times daily as needed. 02/21/20   [provider]  morphine (MSIR) 15 MG tablet Take by mouth. 07/13/18   [provider]  Multiple Vitamins-Minerals (THERA-M) TABS Take 1 tablet by mouth every morning.    [provider]  naloxone [facility]) 4 MG/0.1ML LIQD nasal spray kit U 1 SPR IEN 1 TIME 07/13/18  [provider]  omeprazole (PRILOSEC) 20 MG capsule Take by mouth. 09/04/19   [provider]  Oxycodone HCl 10 MG TABS Take by mouth.    [provider]  oxycodone-acetaminophen (LYNOX) 10-300 MG tablet Take 1 tablet by mouth every 4 (four) hours as needed for pain.    [provider]  potassium chloride (MICRO-K) 10 MEQ CR capsule TAKE 1 CAPSULE BY MOUTH WITH EACH LASIX(FUROSEMIDE) DOSE 10/21/14   [provider]  rosuvastatin (CRESTOR) 20 MG tablet Take 20 mg by mouth at bedtime.    [provider]  testosterone cypionate (DEPOTESTOSTERONE CYPIONATE) 200 MG/ML injection INJECT 0.75 ML INTO THE MUSCLE ONCE EVERY 10 DAYS 12/10/14   [provider]  traZODone (DESYREL) 150 MG tablet Take by mouth.    [provider]  valACYclovir (VALTREX) 1000 MG tablet Take  1 tablet (1,000 mg total) by mouth 3 (three) times daily. 06/22/22   Trevor Iha, FNP    Family History Family History  Problem Relation Age of Onset   Heart failure Mother    Diabetes Mother    Cancer Father     Social History Social History   Tobacco Use   Smoking status: Former    Types: Cigarettes   Smokeless tobacco: Never  Vaping Use   Vaping status: Never Used  Substance Use Topics   Alcohol use: Not Currently   Drug use: Not Currently     Allergies   Blue dyes (parenteral), Black walnut pollen allergy skin test, and Lisinopril   Review of Systems Review of Systems  Musculoskeletal:  Positive for back pain.     Physical Exam Triage Vital Signs ED Triage Vitals  Encounter Vitals Group     BP 08/25/23 1225 118/82     Systolic BP Percentile --      Diastolic BP Percentile --      Pulse Rate 08/25/23 1225 94     Resp 08/25/23 1225 16     Temp 08/25/23 1225 98.1 F (36.7 C)     Temp src --      SpO2 08/25/23 1225 98 %     Weight --      Height --      Head Circumference --      Peak Flow --      Pain Score 08/25/23 1224 8     Pain Loc --      Pain Education --      Exclude from Growth Chart --    No data found.  Updated Vital Signs BP 118/82   Pulse 94   Temp 98.1 F (36.7 C)   Resp 16   SpO2 98%   Visual Acuity Right Eye Distance:   Left Eye Distance:   Bilateral Distance:    Right Eye Near:   Left Eye Near:    Bilateral Near:     Physical Exam Vitals reviewed.  Constitutional:      General: He is not in acute distress.    Appearance: He is not toxic-appearing.  HENT:     Mouth/Throat:     Mouth: Mucous membranes are moist.  Eyes:     Extraocular Movements: Extraocular movements intact.     Conjunctiva/sclera: Conjunctivae normal.     Pupils: Pupils are equal, round, and reactive to light.  Cardiovascular:     Rate and Rhythm: Normal rate and regular rhythm.     Heart sounds: No murmur heard. Pulmonary:     Effort:  Pulmonary effort  is normal.     Breath sounds: Normal breath sounds.  Musculoskeletal:     Cervical back: Neck supple.     Comments: There is tenderness along the lumbosacral area bilaterally.  Straight leg raise causes radiation of the pain into the calves bilaterally.  Lymphadenopathy:     Cervical: No cervical adenopathy.  Skin:    Coloration: Skin is not jaundiced or pale.  Neurological:     General: No focal deficit present.     Mental Status: He is alert and oriented to person, place, and time.  Psychiatric:        Behavior: Behavior normal.      UC Treatments / Results  Labs (all labs ordered are listed, but only abnormal results are displayed) Labs Reviewed  BASIC METABOLIC PANEL  CBC WITH DIFFERENTIAL/PLATELET    EKG   Radiology No results found.  Procedures Procedures (including critical care time)  Medications Ordered in UC Medications  ketorolac (TORADOL) 30 MG/ML injection 30 mg (has no administration in time range)    Initial Impression / Assessment and Plan / UC Course  I have reviewed the triage vital signs and the nursing notes.  Pertinent labs & imaging results that were available during my care of the patient were reviewed by me and considered in my medical decision making (see chart for details).     Toradol injection is given here.  Prednisone for 5 days is sent in, in the hopes that that will help.  I have asked him to contact his primary care and his pain management.  CBC and BMP are drawn today to address his dizziness.  Our staff will call him if anything is significantly abnormal. Final Clinical Impressions(s) / UC Diagnoses   Final diagnoses:  Acute bilateral low back pain with bilateral sciatica  Dizziness     Discharge Instructions      Take prednisone 20 mg--2 daily for 5 days  You have been given a shot of Toradol 30 mg today.  Please follow-up with your pain management and your primary care about this issue.  We have  drawn blood to check your blood counts and your electrolytes and sugar and kidney function numbers.  Our staff will notify you if anything is significantly abnormal.     ED Prescriptions     Medication Sig Dispense Auth. Provider   predniSONE (DELTASONE) 20 MG tablet Take 2 tablets (40 mg total) by mouth daily with breakfast for 5 days. 10 tablet Marlinda Mike Janace Aris, MD      I have reviewed the PDMP during this encounter.   Zenia Resides, MD 08/25/23 [phone]

## 2023-08-26 LAB — CBC WITH DIFFERENTIAL/PLATELET
Basophils Absolute: 0.1 10*3/uL (ref 0.0–0.2)
Basos: 1 %
EOS (ABSOLUTE): 0.2 10*3/uL (ref 0.0–0.4)
Eos: 2 %
Hematocrit: 55 % — ABNORMAL HIGH (ref 37.5–51.0)
Hemoglobin: 18.2 g/dL — ABNORMAL HIGH (ref 13.0–17.7)
Immature Grans (Abs): 0.2 10*3/uL — ABNORMAL HIGH (ref 0.0–0.1)
Immature Granulocytes: 3 %
Lymphocytes Absolute: 1.7 10*3/uL (ref 0.7–3.1)
Lymphs: 23 %
MCH: 27.7 pg (ref 26.6–33.0)
MCHC: 33.1 g/dL (ref 31.5–35.7)
MCV: 84 fL (ref 79–97)
Monocytes Absolute: 0.6 10*3/uL (ref 0.1–0.9)
Monocytes: 9 %
Neutrophils Absolute: 4.3 10*3/uL (ref 1.4–7.0)
Neutrophils: 62 %
Platelets: 158 10*3/uL (ref 150–450)
RBC: 6.57 x10E6/uL — ABNORMAL HIGH (ref 4.14–5.80)
RDW: 15.7 % — ABNORMAL HIGH (ref 11.6–15.4)
WBC: 7 10*3/uL (ref 3.4–10.8)

## 2023-08-26 LAB — BASIC METABOLIC PANEL
BUN/Creatinine Ratio: 7 — ABNORMAL LOW (ref 10–24)
BUN: 7 mg/dL — ABNORMAL LOW (ref 8–27)
CO2: 24 mmol/L (ref 20–29)
Calcium: 8.6 mg/dL (ref 8.6–10.2)
Chloride: 98 mmol/L (ref 96–106)
Creatinine, Ser: 1.01 mg/dL (ref 0.76–1.27)
Glucose: 120 mg/dL — ABNORMAL HIGH (ref 70–99)
Potassium: 4.3 mmol/L (ref 3.5–5.2)
Sodium: 139 mmol/L (ref 134–144)
eGFR: 84 mL/min/{1.73_m2} (ref >59)
